# Patient Record
Sex: Female | Born: 1955 | Race: White | Hispanic: No | Marital: Single | Smoking: Former smoker
Health system: Southern US, Community
[De-identification: ages and names within clinical notes are randomized; demographics above are authoritative.]

## PROBLEM LIST (undated history)

## (undated) DIAGNOSIS — T7840XA Allergy, unspecified, initial encounter: Secondary | ICD-10-CM

## (undated) DIAGNOSIS — Z9889 Other specified postprocedural states: Secondary | ICD-10-CM

## (undated) DIAGNOSIS — R112 Nausea with vomiting, unspecified: Secondary | ICD-10-CM

## (undated) DIAGNOSIS — T4145XA Adverse effect of unspecified anesthetic, initial encounter: Secondary | ICD-10-CM

## (undated) DIAGNOSIS — T8859XA Other complications of anesthesia, initial encounter: Secondary | ICD-10-CM

## (undated) DIAGNOSIS — I10 Essential (primary) hypertension: Secondary | ICD-10-CM

## (undated) DIAGNOSIS — G43909 Migraine, unspecified, not intractable, without status migrainosus: Secondary | ICD-10-CM

## (undated) DIAGNOSIS — K219 Gastro-esophageal reflux disease without esophagitis: Secondary | ICD-10-CM

## (undated) HISTORY — DX: Allergy, unspecified, initial encounter: T78.40XA

## (undated) HISTORY — PX: DILATION AND CURETTAGE OF UTERUS: SHX78

## (undated) HISTORY — PX: HEMORRHOID SURGERY: SHX153

## (undated) HISTORY — DX: Migraine, unspecified, not intractable, without status migrainosus: G43.909

## (undated) HISTORY — DX: Essential (primary) hypertension: I10

## (undated) HISTORY — PX: LAPAROSCOPY: SHX197

---

## 1998-05-16 ENCOUNTER — Ambulatory Visit (HOSPITAL_COMMUNITY): Admission: RE | Admit: 1998-05-16 | Discharge: 1998-05-16 | Payer: Self-pay | Admitting: *Deleted

## 2001-06-09 ENCOUNTER — Other Ambulatory Visit: Admission: RE | Admit: 2001-06-09 | Discharge: 2001-06-09 | Payer: Self-pay | Admitting: *Deleted

## 2002-12-27 HISTORY — PX: OTHER SURGICAL HISTORY: SHX169

## 2002-12-28 ENCOUNTER — Other Ambulatory Visit: Admission: RE | Admit: 2002-12-28 | Discharge: 2002-12-28 | Payer: Self-pay | Admitting: Obstetrics and Gynecology

## 2003-01-18 ENCOUNTER — Ambulatory Visit (HOSPITAL_COMMUNITY): Admission: RE | Admit: 2003-01-18 | Discharge: 2003-01-18 | Payer: Self-pay | Admitting: Obstetrics and Gynecology

## 2004-05-09 ENCOUNTER — Other Ambulatory Visit: Admission: RE | Admit: 2004-05-09 | Discharge: 2004-05-09 | Payer: Self-pay | Admitting: Obstetrics and Gynecology

## 2006-01-28 ENCOUNTER — Ambulatory Visit (HOSPITAL_BASED_OUTPATIENT_CLINIC_OR_DEPARTMENT_OTHER): Admission: RE | Admit: 2006-01-28 | Discharge: 2006-01-28 | Payer: Self-pay | Admitting: *Deleted

## 2006-01-28 ENCOUNTER — Encounter (INDEPENDENT_AMBULATORY_CARE_PROVIDER_SITE_OTHER): Payer: Self-pay | Admitting: *Deleted

## 2006-03-04 ENCOUNTER — Other Ambulatory Visit: Admission: RE | Admit: 2006-03-04 | Discharge: 2006-03-04 | Payer: Self-pay | Admitting: Obstetrics and Gynecology

## 2007-03-19 ENCOUNTER — Other Ambulatory Visit: Admission: RE | Admit: 2007-03-19 | Discharge: 2007-03-19 | Payer: Self-pay | Admitting: Obstetrics and Gynecology

## 2008-05-10 ENCOUNTER — Other Ambulatory Visit: Admission: RE | Admit: 2008-05-10 | Discharge: 2008-05-10 | Payer: Self-pay | Admitting: Obstetrics & Gynecology

## 2010-05-10 ENCOUNTER — Ambulatory Visit: Payer: Self-pay | Admitting: Family Medicine

## 2010-05-15 ENCOUNTER — Encounter: Admission: RE | Admit: 2010-05-15 | Discharge: 2010-05-15 | Payer: Self-pay | Admitting: Family Medicine

## 2010-05-18 ENCOUNTER — Ambulatory Visit: Payer: Self-pay | Admitting: Family Medicine

## 2010-05-26 ENCOUNTER — Encounter: Admission: RE | Admit: 2010-05-26 | Discharge: 2010-05-26 | Payer: Self-pay | Admitting: Chiropractic Medicine

## 2010-05-28 ENCOUNTER — Ambulatory Visit: Payer: Self-pay | Admitting: Family Medicine

## 2010-09-11 ENCOUNTER — Ambulatory Visit: Payer: Self-pay | Admitting: Family Medicine

## 2011-03-15 NOTE — Op Note (Signed)
Debra Banks, Debra Banks                           ACCOUNT NO.:  [id number]   MEDICAL RECORD NO.:  [id number]                   PATIENT TYPE:  AMB   LOCATION:  [facility]                                  FACILITY:  [facility]   PHYSICIAN:  Laqueta Linden, M.D.                 DATE OF BIRTH:  1956/08/03   DATE OF PROCEDURE:  01/18/2003  DATE OF DISCHARGE:                                 OPERATIVE REPORT   PREOPERATIVE DIAGNOSES:  Labial hypertrophy.   POSTOPERATIVE DIAGNOSES:  Labial hypertrophy.   PROCEDURE:  Labial excision, revision.   SURGEON:  Laqueta Linden, M.D.   ASSISTANT:  Andres Ege, M.D.   ANESTHESIA:  General LMA, pudendal block utilizing 20 mL of 0.5% Marcaine  with epinephrine.   ESTIMATED BLOOD LOSS:  50 mL.   COUNTS:  Correct.   COMPLICATIONS:  None.   INDICATIONS:  The patient is a 55 year old female with hypertrophy of labia  minora which swell and are painful with prolonged sitting or horseback  riding.  For this reason she desires surgical excision, labial revision.  She has voiced her understanding of risks of the procedure including  infection, bleeding, swelling, hematoma possibly requiring return to the  operating room, as well as postoperative pain, as well as suboptimal  cosmetic results.  She has voiced her understanding and acceptance of all  these risks and agrees to proceed.  She has received 1 g of IV antibiotic  prophylaxis preoperatively.   PROCEDURE:  The patient was taken to the operating room.  After proper  identification and consents were ascertained she was placed on the operating  table in the supine position.  After the induction of general LMA anesthesia  she was placed in the Kathryn stirrups and the perineum and vagina were  prepped and draped in a routine sterile fashion.  The labia minora on the  patient's left was injected with 1% plain Xylocaine.  A wedge excision of  the redundant labia was then performed.  Multiple small bleeding  points were  cauterized.  Several additional edges of tissue were taken to attempt to  create a much reduced, but symmetrical labia.  The incision was then closed  in an interrupted fashion using 4-0 Vicryl suture after subcutaneous  hemostasis was ascertained.  An identical procedure was carried out on the  right labia minora with an effort to make both labia symmetrical.  The  excised tissue was discarded.  Hemostasis was noted to be excellent.  There  was no excessive swelling or evidence of hematoma formation.  Pudendal block  was then placed bilaterally using 10 mL on each side of 0.5% Marcaine with  epinephrine for local analgesia.  The patient also received Toradol 30 mg  IV, 30 mg IM prior to awakening.  An ice pack was applied to the perineum  immediately.  The patient was stable on transfer to  the recovery room.  She  will be observed at discharge per anesthesia protocol.  She is to follow up  in the office in one week's time or sooner for excessive pain, swelling,  fever, bleeding, or other concerns.  She is to use ice for 24 hours and then  begin sitz baths two to  three times daily.  She was given a prescription for [employer]  dispensed 20 one to two q.4-6h. p.r.n. pain as well as to take Advil or  Aleve as needed.  She is given routine verbal and written instructions and  discharged per anesthesia protocol.                                               Laqueta Linden, M.D.    LKS/MEDQ  D:  01/18/2003  T:  01/18/2003  Job:  [id number]

## 2011-03-15 NOTE — Op Note (Signed)
Debra Banks, Debra Banks               ACCOUNT NO.:  [id number]   MEDICAL RECORD NO.:  [id number]          PATIENT TYPE:  AMB   LOCATION:  [facility]                         FACILITY:  [facility]   PHYSICIAN:  Alfonse Ras, MD   DATE OF BIRTH:  [birth year]   DATE OF PROCEDURE:  01/28/2006  DATE OF DISCHARGE:                                 OPERATIVE REPORT   PREOPERATIVE DIAGNOSIS:  Anal polyp and internal hemorrhoids with prolapse.   POSTOPERATIVE DIAGNOSIS:  Anal polyp and internal hemorrhoids with prolapse.   PROCEDURE:  Excision of anal polyp and PPH rectopexy.   SURGEON:  Alfonse Ras, M.D.   ANESTHESIA:  General.   DESCRIPTION:  The patient was taken to the operating room and placed in a  supine position. After adequate general anesthesia was induced, the patient  was placed in prone jack-knife position.  Perianal and rectal prep were  undertaken.  The anterior rectal polyp was grasped with an Allis clamp and a  2-0 chromic pursestring suture was placed around the base of it.  It was  cauterized, removed, and sent for pathologic evaluation.  A separate very  large posterior anal skin tag was excised at its base using normal  electrocautery.   I then injected 10 mL Marcaine with Wydase into the hemorrhoidal bundles and  an additional 20 mL into the internal and external sphincter muscles.  A 2-0  Prolene suture was placed in a mucosal and submucosal fashion approximately  5 cm proximal to the dentate line.  This was done a pursestring fashion and  was circumferential.  A stapler was then placed in the rectum and the vagina  was checked after it was closed.  It was held in place for 45 seconds and  then fired.  It was removed in a very good donut was visualized without any  muscularis.  The staple line was then inspected.  There was no bleeding.  Gelfoam packing was placed.  The patient tolerated the procedure well and  went to the PACU in good condition.      Alfonse Ras,  MD  Electronically Signed     KRE/MEDQ  D:  01/28/2006  T:  01/28/2006  Job:  [id number]

## 2011-03-28 ENCOUNTER — Other Ambulatory Visit: Payer: Self-pay

## 2011-03-28 MED ORDER — FLUTICASONE PROPIONATE 50 MCG/ACT NA SUSP: 2.0000 | Freq: Every day | NASAL | Status: DC

## 2011-03-28 NOTE — Telephone Encounter (Signed)
Med was filled 

## 2011-05-24 ENCOUNTER — Ambulatory Visit (INDEPENDENT_AMBULATORY_CARE_PROVIDER_SITE_OTHER): Payer: BC Managed Care – PPO | Admitting: Family Medicine

## 2011-05-24 VITALS — BP 120/70 | HR 71 | Temp 98.2°F | Wt 165.0 lb

## 2011-05-24 DIAGNOSIS — J019 Acute sinusitis, unspecified: Secondary | ICD-10-CM

## 2011-05-24 DIAGNOSIS — Z0000 Encounter for general adult medical examination without abnormal findings: Secondary | ICD-10-CM

## 2011-05-24 DIAGNOSIS — J301 Allergic rhinitis due to pollen: Secondary | ICD-10-CM

## 2011-05-24 MED ORDER — CLARITHROMYCIN 500 MG PO TABS: 500.0000 mg | ORAL_TABLET | Freq: Two times a day (BID) | ORAL | Status: AC

## 2011-05-24 NOTE — Progress Notes (Signed)
  Subjective:    Patient ID: Debra Banks, female    DOB: June 24, 1956, 55 y.o.   MRN: [id number]  HPI She has a two-week history that started with sore throat and sinus congestion slight earache but no fever or chills. She use OTC medicines and Flonase in her symptoms got better till 2 days ago when the sinus congestion worsened as well as PND with purulent blood-tinged drainage with a sore throat. No fever or chills. She does not smoke but does have underlying allergies   Review of Systems     Objective:   Physical Exam alert and in no distress. Tympanic membranes and canals are normal. Throat is clear. Tonsils are normal. Neck is supple without adenopathy or thyromegaly. Cardiac exam shows a regular sinus rhythm without murmurs or gallops. Lungs are clear to auscultation. Nasal mucosa is pale and slightly purplish. Tender over frontal and maxillary sinuses.        Assessment & Plan:  Sinusitis with underlying allergic rhinitis Biaxin. If not entirely better, she is to call She also would like a colonoscopy set up.

## 2011-05-24 NOTE — Patient Instructions (Signed)
Take all the antibiotic and if not fully back to normal, call

## 2011-05-27 ENCOUNTER — Telehealth: Payer: Self-pay

## 2011-05-27 NOTE — Telephone Encounter (Signed)
Called and left message for appt [facility] gastroenterology [phone] aug 16th @ 1:15

## 2011-05-28 ENCOUNTER — Encounter: Payer: Self-pay | Admitting: Family Medicine

## 2011-08-21 ENCOUNTER — Telehealth: Payer: Self-pay | Admitting: Family Medicine

## 2011-08-21 NOTE — Telephone Encounter (Signed)
Patient informed that Mucinex did not make blood pressure go up. Instructed her to call if she had any questions.

## 2011-08-21 NOTE — Telephone Encounter (Signed)
Mucinex did not make his blood pressure go up. URI and coughing can make it go up and that's not dangerously high

## 2011-08-26 LAB — HM COLONOSCOPY: HM Colonoscopy: NORMAL

## 2011-08-28 ENCOUNTER — Encounter: Payer: Self-pay | Admitting: Family Medicine

## 2011-09-20 ENCOUNTER — Other Ambulatory Visit: Payer: Self-pay | Admitting: Family Medicine

## 2011-09-20 MED ORDER — LISINOPRIL-HYDROCHLOROTHIAZIDE 10-12.5 MG PO TABS: 1.0000 | ORAL_TABLET | Freq: Every day | ORAL | Status: DC

## 2011-09-20 NOTE — Telephone Encounter (Signed)
PT NEEDS REFILL LISINOPRIL-HCTZ 10/12.5 [facility] [address] [place], [place] [phone]  FAX [phone] UNABLE TO CHANGE PHARMACY IN SYSTEM

## 2011-10-25 ENCOUNTER — Other Ambulatory Visit: Payer: Self-pay | Admitting: Family Medicine

## 2011-10-25 ENCOUNTER — Telehealth: Payer: Self-pay | Admitting: Internal Medicine

## 2011-10-25 NOTE — Telephone Encounter (Signed)
Med already ordered

## 2011-11-05 ENCOUNTER — Ambulatory Visit (INDEPENDENT_AMBULATORY_CARE_PROVIDER_SITE_OTHER): Payer: BC Managed Care – PPO | Admitting: Medical

## 2011-11-05 ENCOUNTER — Encounter: Payer: Self-pay | Admitting: Medical

## 2011-11-05 DIAGNOSIS — I10 Essential (primary) hypertension: Secondary | ICD-10-CM | POA: Insufficient documentation

## 2011-11-05 DIAGNOSIS — R0609 Other forms of dyspnea: Secondary | ICD-10-CM | POA: Insufficient documentation

## 2011-11-05 DIAGNOSIS — R0789 Other chest pain: Secondary | ICD-10-CM

## 2011-11-05 DIAGNOSIS — J309 Allergic rhinitis, unspecified: Secondary | ICD-10-CM | POA: Insufficient documentation

## 2011-11-05 DIAGNOSIS — Z23 Encounter for immunization: Secondary | ICD-10-CM

## 2011-11-05 LAB — COMPREHENSIVE METABOLIC PANEL
ALT: 31 U/L (ref 0–35)
AST: 20 U/L (ref 0–37)
Albumin: 4.7 g/dL (ref 3.5–5.2)
Alkaline Phosphatase: 48 U/L (ref 39–117)
CO2: 25 mEq/L (ref 19–32)
Calcium: 9.8 mg/dL (ref 8.4–10.5)
Total Protein: 7.7 g/dL (ref 6.0–8.3)

## 2011-11-05 LAB — TSH: TSH: 1.796 u[IU]/mL (ref 0.350–4.500)

## 2011-11-05 MED ORDER — LISINOPRIL-HYDROCHLOROTHIAZIDE 10-12.5 MG PO TABS: 1.0000 | ORAL_TABLET | Freq: Every day | ORAL | Status: DC

## 2011-11-05 MED ORDER — LEVOCETIRIZINE DIHYDROCHLORIDE 5 MG PO TABS: 5.0000 mg | ORAL_TABLET | Freq: Every evening | ORAL | Status: DC

## 2011-11-05 MED ORDER — FLUTICASONE PROPIONATE 50 MCG/ACT NA SUSP: 2.0000 | Freq: Every day | NASAL | Status: DC

## 2011-11-05 NOTE — Progress Notes (Signed)
Subjective:   HPI  Debra Banks is a 56 y.o. female who presents with multiple complaints.  She needs refills on her medications for allergies and blood pressure. Her allergies are controlled with her current medications including xyzal and nasal spray. Her blood pressure has been well controlled with her current medication.    She reports a history of intermittent left arm pain for the last one month. She is left-handed. She had one bad episode of left arm pain lasting for 8 hours a month ago, but since then has intermittent arm pain that can last for 10-15 minutes to hours.  She gets occasional neck pain, has seen chiropractor on and off, but not shoulder pain, elbow pain, but instead the pain radiates down from the shoulder to the forearm; denies numbness, tingling, weakness.  Sometimes using the vacuum aggravates the pain but otherwise no specific trigger.  She also reports intermittent episodes of chest tightness for the last month.  This can happen any time, at rest, not necessarily associated with activity. Typically lasts for 10-15 minutes or less. She denies associated chest pain, shortness of breath, nausea, vomiting, sweats, radiating pain to jaw or arm, no paresthesias. In general she has noticed quicker fatigue or dyspnea on exertion compared to the past, although she is not exercising routinely.  Gets occasional palpitations.  She is a nonsmoker.  Her risk factors include controlled hypertension, but no history of diabetes, no significant hyperlipidemia in the past, but there is family history of heart disease in maternal uncles under age 66.  She wonders if these symptoms are related to her heart, wonders if she should be taking a daily baby aspirin.   She would like a flu shot today.   No other aggravating or relieving factors.    No other c/o.   The following portions of the patient's history were reviewed and updated as appropriate: allergies, current medications, past family  history, past medical history, past social history, past surgical history and problem list.  Allergies  Allergen Reactions  . Codeine     No current outpatient prescriptions on file prior to visit.    Past Medical History  Diagnosis Date  . Hypertension   . Allergy     RHINITIS    No past surgical history on file.  Family History  Problem Relation Age of Onset  . Hypertension Father   . Hypertension Sister     History   Social History  . Marital Status: Single    Spouse Name: N/A    Number of Children: N/A  . Years of Education: N/A   Occupational History  . Not on file.   Social History Main Topics  . Smoking status: Never Smoker   . Smokeless tobacco: Not on file  . Alcohol Use: Not on file  . Drug Use: Not on file  . Sexually Active: Not on file   Other Topics Concern  . Not on file   Social History Narrative  . No narrative on file    Review of Systems Constitutional: -fever, -chills, -sweats, -unexpected -weight change,-fatigue ENT: -runny nose, -ear pain, -sore throat Cardiology:  -chest pain, +chest tightness, +palpitations, -edema Respiratory: -cough, -shortness of breath, -wheezing Gastroenterology: -abdominal pain, -nausea, -vomiting, -diarrhea, -constipation  Hematology: -bleeding or bruising problems Musculoskeletal: +arthralgias, +myalgias, -joint swelling, -back pain Ophthalmology: -vision changes Urology: -dysuria, -difficulty urinating, -hematuria, -urinary frequency, -urgency Neurology: -headache, -weakness, -tingling, -numbness      Objective:   Physical Exam  Filed Vitals:  11/05/11 1401  BP: 110/68  Pulse: 72  Temp: 97.7 F (36.5 C)  Resp: 16    General appearance: alert, no distress, WD/WN,  white female  Skin: No rash  Oral cavity: MMM, no lesions Neck: supple, no lymphadenopathy, no thyromegaly, no masses, no bruit  Heart: RRR, normal S1, S2, no murmurs Lungs: CTA bilaterally, no wheezes, rhonchi, or  rales Abdomen: +bs, soft, non tender, non distended, no masses, no hepatomegaly, no splenomegaly Pulses: 2+ symmetric, upper and lower extremities, normal cap refill Neuro: CN 2-12 intact, nonfocal, normal strength and sensation, normal reflexes   MSK: Left internal range of motion slightly reduced otherwise upper extremity exam normal, normal range of motion, nontender, normal throughout upper extremities   Adult ECG Report  Indication: chest tightness  Rate: 62 bpm  Rhythm: normal sinus rhythm  QRS Axis: -17  PR Interval:  QRS Duration: 86ms  QTc:  Conduction Disturbances: none  Other Abnormalities: poor R wave progression, T wave inversion III and V1  Patient's cardiac risk factors are: hypertension and sedentary lifestyle.  EKG comparison: 04/2010 unchanged  Narrative Interpretation: left axis deviation, poor R wave progression, but otherwise no acute change    Assessment and Plan :     Encounter Diagnoses  Name Primary?  . Allergic rhinitis Yes  . Essential hypertension, benign   . Chest tightness   . DOE (dyspnea on exertion)   . Need for prophylactic vaccination and inoculation against influenza      Allergic rhinitis-control current medications, medication refills  Hypertension-controlled on current medication, medication refills, labs today  Chest tightness and dyspnea on exertion-discussed possible etiologies, although etiology is unclear. Discussed symptoms of cardiac etiology although she seems low risk, and symptoms are somewhat atypical.  EKG today unchanged from prior .  Labs today.  Will discuss case with supervising physician, and pending labs we'll decide on next steps.  Discussed symptoms of acute coronary syndrome that would prompt urgent ED visit.  Flu vaccine, vaccine counseling and VIS today.  Follow-up pending labs.

## 2011-11-06 LAB — CBC
MCH: 29.5 pg (ref 26.0–34.0)
MCHC: 34.3 g/dL (ref 30.0–36.0)
MCV: 85.9 fL (ref 78.0–100.0)
RBC: 5.12 MIL/uL — ABNORMAL HIGH (ref 3.87–5.11)
RDW: 13.2 % (ref 11.5–15.5)

## 2012-01-21 LAB — HM PAP SMEAR: HM Pap smear: NEGATIVE

## 2012-02-19 ENCOUNTER — Telehealth: Payer: Self-pay | Admitting: Internal Medicine

## 2012-02-19 MED ORDER — LEVOCETIRIZINE DIHYDROCHLORIDE 5 MG PO TABS: 5.0000 mg | ORAL_TABLET | Freq: Every evening | ORAL | Status: DC

## 2012-02-19 MED ORDER — LISINOPRIL-HYDROCHLOROTHIAZIDE 10-12.5 MG PO TABS: 1.0000 | ORAL_TABLET | Freq: Every day | ORAL | Status: DC

## 2012-02-19 NOTE — Telephone Encounter (Signed)
RX REFILLS WAS ORDERED AND SENT TO HER PHARMACY. CLS

## 2012-03-27 ENCOUNTER — Telehealth: Payer: Self-pay | Admitting: Internal Medicine

## 2012-03-27 MED ORDER — LISINOPRIL-HYDROCHLOROTHIAZIDE 10-12.5 MG PO TABS: 1.0000 | ORAL_TABLET | Freq: Every day | ORAL | Status: DC

## 2012-03-27 MED ORDER — LEVOCETIRIZINE DIHYDROCHLORIDE 5 MG PO TABS: 5.0000 mg | ORAL_TABLET | Freq: Every evening | ORAL | Status: DC

## 2012-03-27 NOTE — Telephone Encounter (Signed)
Changed to 90 day supply

## 2012-03-27 NOTE — Telephone Encounter (Signed)
i sent the patients refills to the pharmacy. CLS

## 2012-08-01 ENCOUNTER — Other Ambulatory Visit: Payer: Self-pay | Admitting: Medical

## 2012-08-31 ENCOUNTER — Other Ambulatory Visit: Payer: Self-pay | Admitting: Medical

## 2012-12-25 ENCOUNTER — Observation Stay (HOSPITAL_COMMUNITY)
Admission: EM | Admit: 2012-12-25 | Discharge: 2012-12-26 | Disposition: A | Discharge status: Home / Self Care | Payer: BC Managed Care – PPO | Attending: Internal Medicine | Admitting: Internal Medicine

## 2012-12-25 ENCOUNTER — Encounter (HOSPITAL_COMMUNITY): Payer: Self-pay | Admitting: Emergency Medicine

## 2012-12-25 ENCOUNTER — Emergency Department (HOSPITAL_COMMUNITY): Payer: BC Managed Care – PPO

## 2012-12-25 DIAGNOSIS — N951 Menopausal and female climacteric states: Secondary | ICD-10-CM | POA: Insufficient documentation

## 2012-12-25 DIAGNOSIS — G43909 Migraine, unspecified, not intractable, without status migrainosus: Secondary | ICD-10-CM | POA: Insufficient documentation

## 2012-12-25 DIAGNOSIS — Z87891 Personal history of nicotine dependence: Secondary | ICD-10-CM | POA: Insufficient documentation

## 2012-12-25 DIAGNOSIS — J309 Allergic rhinitis, unspecified: Secondary | ICD-10-CM | POA: Insufficient documentation

## 2012-12-25 DIAGNOSIS — I10 Essential (primary) hypertension: Secondary | ICD-10-CM | POA: Insufficient documentation

## 2012-12-25 DIAGNOSIS — K219 Gastro-esophageal reflux disease without esophagitis: Secondary | ICD-10-CM | POA: Insufficient documentation

## 2012-12-25 DIAGNOSIS — Z7982 Long term (current) use of aspirin: Secondary | ICD-10-CM | POA: Insufficient documentation

## 2012-12-25 DIAGNOSIS — R0609 Other forms of dyspnea: Secondary | ICD-10-CM

## 2012-12-25 DIAGNOSIS — R0789 Other chest pain: Principal | ICD-10-CM | POA: Insufficient documentation

## 2012-12-25 DIAGNOSIS — Z79899 Other long term (current) drug therapy: Secondary | ICD-10-CM | POA: Insufficient documentation

## 2012-12-25 LAB — CBC WITH DIFFERENTIAL/PLATELET
Eosinophils Absolute: 0.1 10*3/uL (ref 0.0–0.7)
Hemoglobin: 16.2 g/dL — ABNORMAL HIGH (ref 12.0–15.0)
Lymphocytes Relative: 22 % (ref 12–46)
Lymphs Abs: 2 10*3/uL (ref 0.7–4.0)
MCV: 85.5 fL (ref 78.0–100.0)
Monocytes Absolute: 0.5 10*3/uL (ref 0.1–1.0)
Monocytes Relative: 5 % (ref 3–12)
Neutrophils Relative %: 71 % (ref 43–77)
Platelets: 366 10*3/uL (ref 150–400)
RBC: 5.19 MIL/uL — ABNORMAL HIGH (ref 3.87–5.11)
RDW: 12.9 % (ref 11.5–15.5)

## 2012-12-25 LAB — COMPREHENSIVE METABOLIC PANEL
AST: 19 U/L (ref 0–37)
Alkaline Phosphatase: 58 U/L (ref 39–117)
BUN: 22 mg/dL (ref 6–23)
Creatinine, Ser: 0.85 mg/dL (ref 0.50–1.10)
GFR calc Af Amer: 88 mL/min — ABNORMAL LOW (ref >90)
Glucose, Bld: 102 mg/dL — ABNORMAL HIGH (ref 70–99)
Potassium: 3.8 mEq/L (ref 3.5–5.1)
Sodium: 139 mEq/L (ref 135–145)
Total Bilirubin: 0.3 mg/dL (ref 0.3–1.2)

## 2012-12-25 LAB — POCT I-STAT TROPONIN I: Troponin i, poc: 0 ng/mL (ref 0.00–0.08)

## 2012-12-25 MED ORDER — SODIUM CHLORIDE 0.9 % IV SOLN
INTRAVENOUS | Status: AC
  Administered 2012-12-26: via INTRAVENOUS

## 2012-12-25 NOTE — H&P (Signed)
PCP:  Carollee Herter, MD    Chief Complaint:  Chest tightness  HPI: Debra Banks is a 57 y.o. female   has a past medical history of Hypertension; Allergy; and Migraines.   Presented with  Chest tightness started today at noon and continues until 10 pm. 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 Initially she was short of breath but it went away she also felt tightness in both sides of her neck. She felt cold and clammy. Patient states that she had a bit of nausea but never vomited. She reports that she never had a stress test.  Patient has been on testosterone and progesteron therapy to "make her feel better" and to help her deal with menopause.    Chest x-ray EKG and cardiac markers so far negative hospitalist admit for further workup.    Review of Systems:    Pertinent positives include: chest pain, dyspnea on exertion, nausea,   Constitutional:  No weight loss, night sweats, Fevers, chills, fatigue, weight loss  HEENT:  No headaches, Difficulty swallowing,Tooth/dental problems,Sore throat,  No sneezing, itching, ear ache, nasal congestion, post nasal drip,  Cardio-vascular:  No Orthopnea, PND, anasarca, dizziness, palpitations.no Bilateral lower extremity swelling  GI:  No heartburn, indigestion, abdominal pain, vomiting, diarrhea, change in bowel habits, loss of appetite, melena, blood in stool, hematemesis Resp:  no shortness of breath at rest. No excess mucus, no productive cough, No non-productive cough, No coughing up of blood.No change in color of mucus.No wheezing. Skin:  no rash or lesions. No jaundice GU:  no dysuria, change in color of urine, no urgency or frequency. No straining to urinate.  No flank pain.  Musculoskeletal:  No joint pain or no joint swelling. No decreased range of motion. No back pain.  Psych:  No change in mood or affect. No depression or anxiety. No memory loss.  Neuro:  no localizing neurological complaints, no tingling, no weakness, no double vision, no gait abnormality, no slurred speech, no confusion  Otherwise ROS are negative except for above, 10 systems were reviewed  Past Medical History: Past Medical History  Diagnosis Date  . Hypertension   . Allergy     RHINITIS  . Migraines     with aura   Past Surgical History  Procedure Laterality Date  . Laparoscopy      age 88 to r/o ovarian cyst  . Labial revision  12/2002    secondary to hypertrophy     Medications: Prior to Admission medications   Medication Sig Start Date End Date Taking? Authorizing Provider  aspirin EC 81 MG tablet Take 243 mg by mouth once.   Yes Historical Provider, MD  Cholecalciferol (VITAMIN D-3 PO) Take 1 tablet by mouth daily.   Yes Historical Provider, MD  fish oil-omega-3 fatty acids 1000 MG capsule Take 1 g by mouth daily.   Yes Historical Provider, MD  levocetirizine (XYZAL) 5 MG tablet Take 5 mg by mouth every evening.   Yes Historical Provider, MD  lisinopril-hydrochlorothiazide (PRINZIDE,ZESTORETIC) 10-12.5 MG per tablet Take 1 tablet by mouth daily. 03/27/12  Yes Kermit Balo Tysinger, PA  Magnesium 500 MG TABS Take 1 tablet by mouth daily.   Yes Historical Provider, MD  progesterone (PROMETRIUM) 200 MG capsule Take 200 mg by mouth daily.   Yes Historical Provider, MD  ranitidine (ZANTAC) 150 MG tablet Take 150 mg by mouth daily.   Yes Historical Provider, MD  Testosterone 75  MG PLLT 1 each by Implant route once.   Yes Historical Provider, MD    Allergies:   Allergies  Allergen Reactions  . Codeine     Social History:  Ambulatory  Independently  Lives at  Home with family   reports that she has quit smoking. She does not have any smokeless tobacco history on file. She reports that  drinks alcohol. She reports that she does not use illicit drugs.   Family History: family history includes Hyperlipidemia in her mother and Hypertension in her father and  sister.    Physical Exam: Patient Vitals for the past 24 hrs:  BP Temp Temp src Pulse Resp SpO2  12/25/12 2245 114/72 mmHg - - 78 15 94 %  12/25/12 2230 113/62 mmHg - - 81 19 96 %  12/25/12 2200 135/84 mmHg - - 88 15 95 %  12/25/12 2030 137/90 mmHg - - 105 18 98 %  12/25/12 1914 142/81 mmHg 98.1 F (36.7 C) Oral 86 16 96 %    1. General:  in No Acute distress 2. Psychological: Alert and  Oriented 3. Head/ENT:   Moist  Mucous Membranes                          Head Non traumatic, neck supple                          Normal  Dentition 4. SKIN: normal Skin turgor,  Skin clean Dry and intact no rash 5. Heart: Regular rate and rhythm no Murmur, Rub or gallop 6. Lungs: Clear to auscultation bilaterally, no wheezes or crackles   7. Abdomen: Soft, non-tender, Non distended 8. Lower extremities: no clubbing, cyanosis, or edema 9. Neurologically Grossly intact, moving all 4 extremities equally 10. MSK: Normal range of motion  body mass index is unknown because there is no height or weight on file.   Labs on Admission:   Recent Labs  12/25/12 1913  NA 139  K 3.8  CL 101  CO2 25  GLUCOSE 102*  BUN 22  CREATININE 0.85  CALCIUM 10.3    Recent Labs  12/25/12 1913  AST 19  ALT 24  ALKPHOS 58  BILITOT 0.3  PROT 8.8*  ALBUMIN 4.6   No results found for this basename: LIPASE, AMYLASE,  in the last 72 hours  Recent Labs  12/25/12 1913  WBC 9.0  NEUTROABS 6.4  HGB 16.2*  HCT 44.4  MCV 85.5  PLT 366   No results found for this basename: CKTOTAL, CKMB, CKMBINDEX, TROPONINI,  in the last 72 hours No results found for this basename: TSH, T4TOTAL, FREET3, T3FREE, THYROIDAB,  in the last 72 hours No results found for this basename: VITAMINB12, FOLATE, FERRITIN, TIBC, IRON, RETICCTPCT,  in the last 72 hours No results found for this basename: HGBA1C    CrCl is unknown because there is no height on file for the current visit. ABG No results found for this basename: phart,  pco2, po2, hco3, tco2, acidbasedef, o2sat     No results found for this basename: DDIMER     Other results:  I have pearsonaly reviewed this: ECG REPORT  Rate: 88   Rhythm: NSR ST&T Change: no ischemic changes   Cultures: No results found for this basename: sdes, specrequest, cult, reptstatus       Radiological Exams on Admission: Dg Chest 2 View  12/25/2012  *RADIOLOGY REPORT*  Clinical Data: Chest pain  CHEST - 2 VIEW  Comparison: None.  Findings: Lungs are clear.  No pleural effusion or pneumothorax.  Cardiomediastinal silhouette is within normal limits.  Mild degenerative changes of the visualized thoracolumbar spine.  IMPRESSION: No evidence of acute cardiopulmonary disease.   Original Report Authenticated By: Charline Bills, M.D.     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 Chest tightness - somewhat atypical giving  that lasted for 10 hours. Currently chest pain-free we'll admit to telemetry cycle cardiac markers obtained serial EKGs an echogram to evaluate wall motion abnormality. Father risk stratify with lipid panel check TSH. Other studies depending on results. Will continue patient's aspirin but hold who hormonal therapy.  . Essential hypertension, benign - continue lisinopril    Prophylaxis:  Lovenox, Protonix  CODE STATUS: FULL CODE   Other plan as per orders.  I have spent a total of 55 min on this admission  Debra Banks 12/25/2012, 11:33 PM

## 2012-12-25 NOTE — ED Notes (Signed)
Pt states she took three aspirins prior to arriving to ED

## 2012-12-25 NOTE — ED Notes (Signed)
PT. REPORTS LEFT CHEST PRESSURE / TIGHTNESS RADIATING TO LEFT ARM ONSET TODAY , DENIES SOB , OCCASIONAL DRY COUGH WITH SLIGHT NAUSEA.

## 2012-12-25 NOTE — ED Provider Notes (Signed)
History     CSN: [id number]  Arrival date & time 12/25/12  1903   First MD Initiated Contact with Patient 12/25/12 2149      Chief Complaint  Patient presents with  . Chest Pain    (Consider location/radiation/quality/duration/timing/severity/associated sxs/prior treatment) HPI Comments: Patient presents here with discomfort in the left chest and left arm that has occurred intermittently over the past two weeks.  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  She feels somewhat short of breath and nauseated.  No fevers or chills.  No cough.  She reports two other episodes in the past two weeks similar to this but not as bad.  Risk factors include htn, family history.    Patient is a 57 y.o. female presenting with chest pain. The history is provided by the patient.  Chest Pain Pain location:  Substernal area and L chest Pain quality comment:  Heaviness Pain radiates to:  L arm Pain radiates to the back: no   Pain severity:  Moderate Timing:  Intermittent Progression:  Worsening Chronicity:  Recurrent Relieved by:  Nothing Worsened by:  Nothing tried Ineffective treatments:  None tried   Past Medical History  Diagnosis Date  . Hypertension   . Allergy     RHINITIS  . Migraines     with aura    Past Surgical History  Procedure Laterality Date  . Laparoscopy      age 47 to r/o ovarian cyst  . Labial revision  12/2002    secondary to hypertrophy    Family History  Problem Relation Age of Onset  . Hypertension Father   . Hypertension Sister   . Hyperlipidemia Mother     History  Substance Use Topics  . Smoking status: Former Games developer  . Smokeless tobacco: Not on file  . Alcohol Use: Yes     Comment: 12 pack a week 2-3 days a week    OB History   Grav Para Term Preterm Abortions TAB SAB Ect Mult Living   0 0 0 0 0 0 0 0 0 0       Review of Systems  Cardiovascular: Positive for chest pain.  All  other systems reviewed and are negative.    Allergies  Codeine  Home Medications   Current Outpatient Rx  Name  Route  Sig  Dispense  Refill  . aspirin EC 81 MG tablet   Oral   Take 243 mg by mouth once.         . Cholecalciferol (VITAMIN D-3 PO)   Oral   Take 1 tablet by mouth daily.         . fish oil-omega-3 fatty acids 1000 MG capsule   Oral   Take 1 g by mouth daily.         Marland Kitchen levocetirizine (XYZAL) 5 MG tablet   Oral   Take 5 mg by mouth every evening.         Marland Kitchen lisinopril-hydrochlorothiazide (PRINZIDE,ZESTORETIC) 10-12.5 MG per tablet   Oral   Take 1 tablet by mouth daily.   90 tablet   1   . Magnesium 500 MG TABS   Oral   Take 1 tablet by mouth daily.         . progesterone (PROMETRIUM) 200 MG capsule   Oral   Take 200 mg by mouth daily.         . ranitidine (ZANTAC) 150 MG  tablet   Oral   Take 150 mg by mouth daily.         . Testosterone 75 MG PLLT   Implant   1 each by Implant route once.           BP 114/72  Pulse 78  Temp(Src) 98.1 F (36.7 C) (Oral)  Resp 15  SpO2 94%  LMP 11/24/2012  Physical Exam  Nursing note and vitals reviewed. Constitutional: She is oriented to person, place, and time. She appears well-developed and well-nourished. No distress.  HENT:  Head: Normocephalic and atraumatic.  Neck: Normal range of motion. Neck supple.  Cardiovascular: Normal rate and regular rhythm.  Exam reveals no gallop and no friction rub.   No murmur heard. Pulmonary/Chest: Effort normal and breath sounds normal. No respiratory distress. She has no wheezes.  Abdominal: Soft. Bowel sounds are normal. She exhibits no distension. There is no tenderness.  Musculoskeletal: Normal range of motion.  Neurological: She is alert and oriented to person, place, and time.  Skin: Skin is warm and dry. She is not diaphoretic.    ED Course  Procedures (including critical care time)  Labs Reviewed  COMPREHENSIVE METABOLIC PANEL -  Abnormal; Notable for the following:    Glucose, Bld 102 (*)    Total Protein 8.8 (*)    GFR calc non Af Amer 76 (*)    GFR calc Af Amer 88 (*)    All other components within normal limits  CBC WITH DIFFERENTIAL - Abnormal; Notable for the following:    RBC 5.19 (*)    Hemoglobin 16.2 (*)    MCHC 36.5 (*)    All other components within normal limits  POCT I-STAT TROPONIN I  POCT I-STAT TROPONIN I   Dg Chest 2 View  12/25/2012  *RADIOLOGY REPORT*  Clinical Data: Chest pain  CHEST - 2 VIEW  Comparison: None.  Findings: Lungs are clear.  No pleural effusion or pneumothorax.  Cardiomediastinal silhouette is within normal limits.  Mild degenerative changes of the visualized thoracolumbar spine.  IMPRESSION: No evidence of acute cardiopulmonary disease.   Original Report Authenticated By: Charline Bills, M.D.      No diagnosis found.   Date: 12/25/2012  Rate: 96  Rhythm: normal sinus rhythm  QRS Axis: left  Intervals: normal  ST/T Wave abnormalities: normal  Conduction Disutrbances:none  Narrative Interpretation:   Old EKG Reviewed: unchanged    MDM  The patient presents here for evaluation of chest discomfort.  The workup, including two troponins is negative.  She is feeling better now.  She took 3 baby aspirins at home prior to arrival.  Due to the nature of her symptoms and the presence of risk factors, I have spoken with [facility] regarding admission.          Geoffery Lyons, MD 12/25/12 [phone]

## 2012-12-26 ENCOUNTER — Observation Stay (HOSPITAL_COMMUNITY): Payer: BC Managed Care – PPO

## 2012-12-26 DIAGNOSIS — E785 Hyperlipidemia, unspecified: Secondary | ICD-10-CM | POA: Diagnosis present

## 2012-12-26 DIAGNOSIS — R079 Chest pain, unspecified: Secondary | ICD-10-CM

## 2012-12-26 LAB — TROPONIN I
Troponin I: <0.3 ng/mL (ref <0.30)
Troponin I: <0.3 ng/mL (ref <0.30)
Troponin I: <0.3 ng/mL (ref <0.30)

## 2012-12-26 LAB — COMPREHENSIVE METABOLIC PANEL
AST: 15 U/L (ref 0–37)
BUN: 17 mg/dL (ref 6–23)
CO2: 26 mEq/L (ref 19–32)
Calcium: 10 mg/dL (ref 8.4–10.5)
Chloride: 104 mEq/L (ref 96–112)
Creatinine, Ser: 0.86 mg/dL (ref 0.50–1.10)
GFR calc non Af Amer: 75 mL/min — ABNORMAL LOW (ref >90)
Total Bilirubin: 0.5 mg/dL (ref 0.3–1.2)

## 2012-12-26 LAB — TSH: TSH: 7.355 u[IU]/mL — ABNORMAL HIGH (ref 0.350–4.500)

## 2012-12-26 LAB — PHOSPHORUS: Phosphorus: 3.8 mg/dL (ref 2.3–4.6)

## 2012-12-26 LAB — LIPID PANEL
LDL Cholesterol: 153 mg/dL — ABNORMAL HIGH (ref 0–99)
Total CHOL/HDL Ratio: 4 RATIO
Triglycerides: 102 mg/dL (ref <150)
VLDL: 20 mg/dL (ref 0–40)

## 2012-12-26 LAB — CBC
Hemoglobin: 15.4 g/dL — ABNORMAL HIGH (ref 12.0–15.0)
MCH: 30.3 pg (ref 26.0–34.0)
Platelets: 341 10*3/uL (ref 150–400)
RBC: 5.08 MIL/uL (ref 3.87–5.11)
WBC: 8.6 10*3/uL (ref 4.0–10.5)

## 2012-12-26 MED ORDER — LISINOPRIL 10 MG PO TABS
10.0000 mg | ORAL_TABLET | Freq: Every day | ORAL | Status: DC
  Administered 2012-12-26: 10 mg via ORAL
  Filled 2012-12-26: qty 1

## 2012-12-26 MED ORDER — ACETAMINOPHEN 650 MG RE SUPP: 650.0000 mg | Freq: Four times a day (QID) | RECTAL | Status: DC | PRN

## 2012-12-26 MED ORDER — MORPHINE SULFATE 2 MG/ML IJ SOLN: 1.0000 mg | INTRAMUSCULAR | Status: DC | PRN

## 2012-12-26 MED ORDER — HYDROCODONE-ACETAMINOPHEN 5-325 MG PO TABS: 1.0000 | ORAL_TABLET | ORAL | Status: DC | PRN

## 2012-12-26 MED ORDER — REGADENOSON 0.4 MG/5ML IV SOLN
INTRAVENOUS | Status: AC
  Filled 2012-12-26: qty 5

## 2012-12-26 MED ORDER — ONDANSETRON HCL 4 MG/2ML IJ SOLN: 4.0000 mg | Freq: Four times a day (QID) | INTRAMUSCULAR | Status: DC | PRN

## 2012-12-26 MED ORDER — ATORVASTATIN CALCIUM 20 MG PO TABS
20.0000 mg | ORAL_TABLET | Freq: Every day | ORAL | Status: DC
  Filled 2012-12-26: qty 1

## 2012-12-26 MED ORDER — ALBUTEROL SULFATE (5 MG/ML) 0.5% IN NEBU: 2.5000 mg | INHALATION_SOLUTION | RESPIRATORY_TRACT | Status: DC | PRN

## 2012-12-26 MED ORDER — ONDANSETRON HCL 4 MG PO TABS: 4.0000 mg | ORAL_TABLET | Freq: Four times a day (QID) | ORAL | Status: DC | PRN

## 2012-12-26 MED ORDER — ENOXAPARIN SODIUM 40 MG/0.4ML ~~LOC~~ SOLN
40.0000 mg | SUBCUTANEOUS | Status: DC
  Administered 2012-12-26: 40 mg via SUBCUTANEOUS
  Filled 2012-12-26: qty 0.4

## 2012-12-26 MED ORDER — TECHNETIUM TC 99M SESTAMIBI - CARDIOLITE
10.0000 | Freq: Once | INTRAVENOUS | Status: AC | PRN
  Administered 2012-12-26: 10 via INTRAVENOUS

## 2012-12-26 MED ORDER — ASPIRIN EC 81 MG PO TBEC
243.0000 mg | DELAYED_RELEASE_TABLET | Freq: Once | ORAL | Status: DC
  Filled 2012-12-26: qty 3

## 2012-12-26 MED ORDER — REGADENOSON 0.4 MG/5ML IV SOLN
0.4000 mg | Freq: Once | INTRAVENOUS | Status: AC
  Administered 2012-12-26: 0.4 mg via INTRAVENOUS

## 2012-12-26 MED ORDER — ATORVASTATIN CALCIUM 20 MG PO TABS: 20.0000 mg | ORAL_TABLET | Freq: Every day | ORAL | Status: DC

## 2012-12-26 MED ORDER — TECHNETIUM TC 99M SESTAMIBI - CARDIOLITE
30.0000 | Freq: Once | INTRAVENOUS | Status: AC | PRN
  Administered 2012-12-26: 11:00:00 30 via INTRAVENOUS

## 2012-12-26 MED ORDER — ACETAMINOPHEN 325 MG PO TABS: 650.0000 mg | ORAL_TABLET | Freq: Four times a day (QID) | ORAL | Status: DC | PRN

## 2012-12-26 MED ORDER — PANTOPRAZOLE SODIUM 40 MG PO TBEC
40.0000 mg | DELAYED_RELEASE_TABLET | Freq: Every day | ORAL | Status: DC
  Administered 2012-12-26: 40 mg via ORAL
  Filled 2012-12-26: qty 1

## 2012-12-26 MED ORDER — SODIUM CHLORIDE 0.9 % IJ SOLN: 3.0000 mL | Freq: Two times a day (BID) | INTRAMUSCULAR | Status: DC

## 2012-12-26 MED ORDER — DOCUSATE SODIUM 100 MG PO CAPS
100.0000 mg | ORAL_CAPSULE | Freq: Two times a day (BID) | ORAL | Status: DC
  Filled 2012-12-26 (×3): qty 1

## 2012-12-26 NOTE — Consult Note (Signed)
CARDIOLOGY CONSULT NOTE  Patient ID: Debra Banks MRN: [id number] DOB/AGE: [birth year] 55 y.o.  Admit date: 12/25/2012 Referring Physician  Ripu Rai Primary Physician:  Carollee Herter, MD Reason for Consultation  chest pain  HPI: Patient is a 57 year old Caucasian female with history of hypertension and allergic rhinitis, history of migraine headaches who was recently evaluated in the outpatient setting for atypical left arm pain. She had been doing well until about a week ago she had a recurrence of left arm discomfort and also left-sided chest discomfort that lasted for about 2 hours on and off. Yesterday she started to have more severe pain and lasted for several hours with waxing and waning nature. This is associated with mild feeling of clamminess in her hands. She felt nauseous but did not have vomiting. No associated shortness of breath. She felt the chest pain was radiating up her neck. This was at rest and usual activities. No hemoptysis, no leg edema, no recent long travel. And no dizziness or syncope.  Past Medical History  Diagnosis Date  . Hypertension   . Allergy     RHINITIS  . Migraines     with aura     Past Surgical History  Procedure Laterality Date  . Laparoscopy      age 57 to r/o ovarian cyst  . Labial revision  12/2002    secondary to hypertrophy     Family History  Problem Relation Age of Onset  . Hypertension Father   . Hypertension Sister   . Hyperlipidemia Mother      Social History: History   Social History  . Marital Status: Single    Spouse Name: N/A    Number of Children: N/A  . Years of Education: N/A   Occupational History  . Not on file.   Social History Main Topics  . Smoking status: Former Smoker, smoked rarely  . Smokeless tobacco: Not on file  . Alcohol Use: Yes     Comment: 12 pack a week 2-3 days a week  . Drug Use: No  . Sexually Active: Not on file   Other Topics Concern  . Not on file   Social History Narrative   . No narrative on file     Prescriptions prior to admission  Medication Sig Dispense Refill  . aspirin EC 81 MG tablet Take 243 mg by mouth once.      . Cholecalciferol (VITAMIN D-3 PO) Take 1 tablet by mouth daily.      . fish oil-omega-3 fatty acids 1000 MG capsule Take 1 g by mouth daily.      Marland Kitchen levocetirizine (XYZAL) 5 MG tablet Take 5 mg by mouth every evening.      Marland Kitchen lisinopril-hydrochlorothiazide (PRINZIDE,ZESTORETIC) 10-12.5 MG per tablet Take 1 tablet by mouth daily.  90 tablet  1  . Magnesium 500 MG TABS Take 1 tablet by mouth daily.      . progesterone (PROMETRIUM) 200 MG capsule Take 200 mg by mouth daily.      . ranitidine (ZANTAC) 150 MG tablet Take 150 mg by mouth daily.      . Testosterone 75 MG PLLT 1 each by Implant route once.        Scheduled Meds: . sodium chloride   Intravenous STAT  . aspirin EC  243 mg Oral Once  . atorvastatin  20 mg Oral q1800  . docusate sodium  100 mg Oral BID  . enoxaparin (LOVENOX) injection  40 mg Subcutaneous Q24H  . lisinopril  10 mg Oral Daily  . pantoprazole  40 mg Oral Q1200  . sodium chloride  3 mL Intravenous Q12H   Continuous Infusions:  PRN Meds:.acetaminophen, acetaminophen, albuterol, HYDROcodone-acetaminophen, morphine injection, ondansetron (ZOFRAN) IV, ondansetron  ROS: General: no fevers/chills/night sweats Eyes: no blurry vision, diplopia, or amaurosis ENT: no sore throat or hearing loss Resp: no cough, wheezing, or hemoptysis CV: no edema or palpitations GI: no abdominal pain, nausea, vomiting, diarrhea, or constipation GU: no dysuria, frequency, or hematuria Skin: no rash Neuro: no headache, numbness, tingling, or weakness of extremities Musculoskeletal: no joint pain or swelling Heme: no bleeding, DVT, or easy bruising Endo: no polydipsia or polyuria    Physical Exam: Blood pressure 129/79, pulse 68, temperature 98.1 F (36.7 C), temperature source Oral, resp. rate 19, height 5\' 5"  (1.651 m), weight  73.8 kg (162 lb 11.2 oz), last menstrual period 11/23/2012, SpO2 98.00%.   General appearance: alert, cooperative, appears stated age, no distress and mildly obese Neck: no adenopathy, no carotid bruit, no JVD, supple, symmetrical, trachea midline and thyroid not enlarged, symmetric, no tenderness/mass/nodules Lungs: clear to auscultation bilaterally Chest wall: no tenderness Heart: regular rate and rhythm, S1, S2 normal, no murmur, click, rub or gallop Abdomen: soft, non-tender; bowel sounds normal; no masses,  no organomegaly Extremities: extremities normal, atraumatic, no cyanosis or edema Neurologic: Grossly normal  Labs:   Lab Results  Component Value Date   WBC 8.6 12/26/2012   HGB 15.4* 12/26/2012   HCT 43.4 12/26/2012   MCV 85.4 12/26/2012   PLT 341 12/26/2012    Recent Labs Lab 12/26/12 0500  NA 141  K 3.5  CL 104  CO2 26  BUN 17  CREATININE 0.86  CALCIUM 10.0  PROT 7.9  BILITOT 0.5  ALKPHOS 50  ALT 21  AST 15  GLUCOSE 88   Lab Results  Component Value Date   TROPONINI <0.30 12/26/2012    Lipid Panel     Component Value Date/Time   CHOL 231* 12/26/2012 0159   TRIG 102 12/26/2012 0159   HDL 58 12/26/2012 0159   CHOLHDL 4.0 12/26/2012 0159   VLDL 20 12/26/2012 0159   LDLCALC 153* 12/26/2012 0159    EKG: normal EKG, normal sinus rhythm, unchanged from previous tracings.    Radiology: Dg Chest 2 View  12/25/2012  *RADIOLOGY REPORT*  Clinical Data: Chest pain  CHEST - 2 VIEW  Comparison: None.  Findings: Lungs are clear.  No pleural effusion or pneumothorax.  Cardiomediastinal silhouette is within normal limits.  Mild degenerative changes of the visualized thoracolumbar spine.  IMPRESSION: No evidence of acute cardiopulmonary disease.   Original Report Authenticated By: Charline Bills, M.D.     ASSESSMENT AND PLAN:  1. Atypical chest pain 2. Hypertension 3. Hyperlipidemia  Recommendation: Chest pain appears to be atypical, however some of the features are consistent  with unstable angina pectoris. Patient still has mild chest discomfort and persistent but a physical examination and EKG are unremarkable. She's been ruled out by cardiac markers for myocardial injury. I will set her up for a Lexiscan tetrofosmin stress test and unless abnormal, primary prevention strategies indicated. She'll need therapy for hyperlipidemia.   Pamella Pert, MD 12/26/2012, 10:23 AM [facility] [place] Pager: [phone] Office: [phone] If no answer Cell [phone]

## 2012-12-26 NOTE — Progress Notes (Signed)
Patient ID: Debra Banks  female  YQM:[id number]    DOB: 1956-08-26    DOA: 12/25/2012  PCP: Debra Herter, MD  Assessment/Plan: Active Problems:   Chest tightness; currently resolved, during exertion, was factors, hypertension, hyperlipidemia - 2 sets of troponins negative, EKG did not show any acute ischemic changes, no prior cardiac workup ever - 2-D echo ordered, pending - Discussed with Dr. Jacinto Halim for cardiology consult may need a stress test inpatient versus outpatient    Essential hypertension, benign; - Stable, on lisinopril and hydrochlorothiazide    Hyperlipidemia: LDL 153, cholesterol 231 - Started on Lipitor    GERD  - Continue PPI   DVT Prophylaxis:  Code Status:  Disposition: Pending cardiology evaluation, 2-D echo     Subjective: States chest tightness has resolved, no shortness of breath, no nausea vomiting  Objective: Weight change:   Intake/Output Summary (Last 24 hours) at 12/26/12 0934 Last data filed at 12/26/12 0659  Gross per 24 hour  Intake  337.5 ml  Output      0 ml  Net  337.5 ml   Blood pressure 115/69, pulse 73, temperature 97.9 F (36.6 C), temperature source Oral, resp. rate 18, height 5\' 5"  (1.651 m), weight 73.8 kg (162 lb 11.2 oz), last menstrual period 11/23/2012, SpO2 99.00%.  Physical Exam: General: Alert and awake, oriented x3, not in any acute distress. HEENT: anicteric sclera, pupils reactive to light and accommodation, EOMI CVS: S1-S2 clear, no murmur rubs or gallops Chest: clear to auscultation bilaterally, no wheezing, rales or rhonchi Abdomen: soft nontender, nondistended, normal bowel sounds, no organomegaly Extremities: no cyanosis, clubbing or edema noted bilaterally Neuro: Cranial nerves II-XII intact, no focal neurological deficits  Lab Results: Basic Metabolic Panel:  Recent Labs Lab 12/25/12 1913 12/26/12 0500  NA 139 141  K 3.8 3.5  CL 101 104  CO2 25 26  GLUCOSE 102* 88  BUN 22 17  CREATININE  0.85 0.86  CALCIUM 10.3 10.0  MG  --  2.3  PHOS  --  3.8   Liver Function Tests:  Recent Labs Lab 12/25/12 1913 12/26/12 0500  AST 19 15  ALT 24 21  ALKPHOS 58 50  BILITOT 0.3 0.5  PROT 8.8* 7.9  ALBUMIN 4.6 4.2   No results found for this basename: LIPASE, AMYLASE,  in the last 168 hours No results found for this basename: AMMONIA,  in the last 168 hours CBC:  Recent Labs Lab 12/25/12 1913 12/26/12 0500  WBC 9.0 8.6  NEUTROABS 6.4  --   HGB 16.2* 15.4*  HCT 44.4 43.4  MCV 85.5 85.4  PLT 366 341   Cardiac Enzymes:  Recent Labs Lab 12/26/12 0158 12/26/12 0810  TROPONINI <0.30 <0.30   BNP: No components found with this basename: POCBNP,  CBG: No results found for this basename: GLUCAP,  in the last 168 hours   Micro Results: No results found for this or any previous visit (from the past 240 hour(s)).  Studies/Results: Dg Chest 2 View  12/25/2012  *RADIOLOGY REPORT*  Clinical Data: Chest pain  CHEST - 2 VIEW  Comparison: None.  Findings: Lungs are clear.  No pleural effusion or pneumothorax.  Cardiomediastinal silhouette is within normal limits.  Mild degenerative changes of the visualized thoracolumbar spine.  IMPRESSION: No evidence of acute cardiopulmonary disease.   Original Report Authenticated By: Charline Bills, M.D.     Medications: Scheduled Meds: . sodium chloride   Intravenous STAT  . aspirin EC  243 mg Oral Once  .  atorvastatin  20 mg Oral q1800  . docusate sodium  100 mg Oral BID  . enoxaparin (LOVENOX) injection  40 mg Subcutaneous Q24H  . lisinopril  10 mg Oral Daily  . pantoprazole  40 mg Oral Q1200  . sodium chloride  3 mL Intravenous Q12H      LOS: 1 day   [name] M.D. [facility] 12/26/2012, 9:34 AM Pager: [phone]  If 7PM-7AM, please contact night-coverage [email or web address] Password [email or web address]

## 2012-12-26 NOTE — Discharge Summary (Signed)
Physician Discharge Summary  Patient ID: Debra Banks MRN: [id number] DOB/AGE: 1956-08-17 57 y.o.  Admit date: 12/25/2012 Discharge date: 12/26/2012  Primary Care Physician:  Carollee Herter, MD  Discharge Diagnoses:    . Essential hypertension, benign . Chest tightness . hyperlipidemia . GERD   Consults: cardiology, Dr Jacinto Halim    Discharge Medications:   Medication List    TAKE these medications       aspirin EC 81 MG tablet  .     atorvastatin 20 MG tablet  Commonly known as:  LIPITOR  Take 1 tablet (20 mg total) by mouth daily      fish oil-omega-3 fatty acids 1000 MG capsule  Take 1 g by mouth daily.     levocetirizine 5 MG tablet  Commonly known as:  XYZAL  Take 5 mg by mouth every evening.     lisinopril-hydrochlorothiazide 10-12.5 MG per tablet  Commonly known as:  PRINZIDE,ZESTORETIC  Take 1 tablet by mouth daily.     Magnesium 500 MG Tabs  Take 1 tablet by mouth daily.     progesterone 200 MG capsule  Commonly known as:  PROMETRIUM  Take 200 mg by mouth daily.     ranitidine 150 MG tablet  Commonly known as:  ZANTAC  Take 150 mg by mouth daily.     Testosterone 75 MG Pllt  1 each by Implant route once.     VITAMIN D-3 PO  Take 1 tablet by mouth daily.         Brief H and P: For complete details please refer to admission H and P, but in brief Debra Banks is a 57 y.o. female has a past medical history of Hypertension; Allergy; and Migraines. Presented with Chest tightness that started on the same day of admission at noon and continued until 10 pm. 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 Initially she was short of breath but it went away she also felt tightness in both sides of her neck. She felt cold and clammy. Patient states that she had a bit of nausea but never vomited. She reported that she never had a stress test. Patient has been on testosterone and progesteron  therapy to "make her feel better" and to help her deal with menopause. Chest x-ray EKG and cardiac markers so far negative      Hospital Course:  Chest tightness: resolved during hospitalisation, during exertion. Risk factors were hypertension, hyperlipidemia. Patient was admitted for observation, 3 sets of cardiac enzymes were negative. EKG did not show any acute ischemic changes, no prior cardiac workup ever.  Cardiology (Dr Jacinto Halim) was consulted and patient underwent nuclear medicine stress test and showed EF of 77%, negative for any pharmacological stress-induced ischemia. Patient was placed on aspirin and started on Lipitor for hyperlipidemia. She was cleared by cardiology for DC home.   Essential hypertension, benign:  Stable, on lisinopril and hydrochlorothiazide   Hyperlipidemia: LDL 153, cholesterol 231, Started on Lipitor   GERD: Continue ranitidine  Day of Discharge BP 128/66  Pulse 111  Temp(Src) 98.1 F (36.7 C) (Oral)  Resp 19  Ht 5\' 5"  (1.651 m)  Wt 73.8 kg (162 lb 11.2 oz)  BMI 27.07 kg/m2  SpO2 98%  LMP 11/23/2012  Physical Exam: General: Alert and awake oriented x3 not in any acute distress. HEENT: anicteric sclera, pupils reactive to light and accommodation CVS: S1-S2 clear no murmur rubs or gallops Chest: clear to auscultation  bilaterally, no wheezing rales or rhonchi Abdomen: soft nontender, nondistended, normal bowel sounds, no organomegaly Extremities: no cyanosis, clubbing or edema noted bilaterally Neuro: Cranial nerves II-XII intact, no focal neurological deficits   The results of significant diagnostics from this hospitalization (including imaging, microbiology, ancillary and laboratory) are listed below for reference.    LAB RESULTS: Basic Metabolic Panel:  Recent Labs Lab 12/25/12 1913 12/26/12 0500  NA 139 141  K 3.8 3.5  CL 101 104  CO2 25 26  GLUCOSE 102* 88  BUN 22 17  CREATININE 0.85 0.86  CALCIUM 10.3 10.0  MG  --  2.3  PHOS  --   3.8   Liver Function Tests:  Recent Labs Lab 12/25/12 1913 12/26/12 0500  AST 19 15  ALT 24 21  ALKPHOS 58 50  BILITOT 0.3 0.5  PROT 8.8* 7.9  ALBUMIN 4.6 4.2   CBC:  Recent Labs Lab 12/25/12 1913 12/26/12 0500  WBC 9.0 8.6  NEUTROABS 6.4  --   HGB 16.2* 15.4*  HCT 44.4 43.4  MCV 85.5 85.4  PLT 366 341   Cardiac Enzymes:  Recent Labs Lab 12/26/12 0810 12/26/12 1255  TROPONINI <0.30 <0.30    Significant Diagnostic Studies:  Dg Chest 2 View  12/25/2012  *RADIOLOGY REPORT*  Clinical Data: Chest pain  CHEST - 2 VIEW  Comparison: None.  Findings: Lungs are clear.  No pleural effusion or pneumothorax.  Cardiomediastinal silhouette is within normal limits.  Mild degenerative changes of the visualized thoracolumbar spine.  IMPRESSION: No evidence of acute cardiopulmonary disease.   Original Report Authenticated By: Charline Bills, M.D.    Nm Myocar Multi W/spect W/wall Motion / Ef  12/26/2012   IMPRESSION:  1. Negative for pharmacologic-stress induced ischemia.  2. Left ventricular ejection fraction 77%.   Original Report Authenticated By: Charline Bills, M.D.     2D ECHO:   Disposition and Follow-up: Discharge Orders   Future Orders Complete By Expires     Diet - low sodium heart healthy  As directed     Increase activity slowly  As directed         DISPOSITION: home  DIET: hear healthy diet  ACTIVITY: as tolerated   DISCHARGE FOLLOW-UP Follow-up Information   Follow up with Carollee Herter, MD. Schedule an appointment as soon as possible for a visit in 2 weeks. (for hospital follow-up)    Contact information:   [address] [place] [address] [phone]       Time spent on Discharge: 35 mins  Signed:   Beckie Viscardi M.D. [facility] 12/26/2012, 2:45 PM Pager: [phone]

## 2012-12-26 NOTE — Progress Notes (Signed)
Utilization review completed.  

## 2012-12-29 ENCOUNTER — Encounter: Payer: Self-pay | Admitting: Family Medicine

## 2012-12-29 ENCOUNTER — Ambulatory Visit (INDEPENDENT_AMBULATORY_CARE_PROVIDER_SITE_OTHER): Payer: BC Managed Care – PPO | Admitting: Family Medicine

## 2012-12-29 VITALS — BP 118/80 | HR 64 | Wt 170.0 lb

## 2012-12-29 NOTE — Patient Instructions (Signed)
Get the echo done and then do 2 Aleve 3 times per day and if that doesn't work then try switching to Prilosec 1 pill twice a day. Start on the Lipitor but if you have any difficulties with symptoms call me. Followup on Lipitor will be 2 months

## 2012-12-29 NOTE — Progress Notes (Signed)
  Subjective:    Patient ID: Debra Banks, female    DOB: 1955-11-16, 57 y.o.   MRN: [id number]  HPI She is here for a recheck. She was recently evaluated in the hospital for evaluation of left-sided chest tightness as well as radiation into her neck and into her shoulder. The medical record was reviewed. She has subsequently seen Dr. Jacinto Halim and apparently is scheduled for an echocardiogram. He did recommend she take 2 Aleve 3 times per day to see if this would help. She presently is also taking Zantac 150 mg twice a day. She does not give any reflux type symptoms. She does complain of intermittent chest tightness however cannot relate this to physical activity, breathing, position.she was also given Lipitor but has not started taking this yet. Review of her history indicates grandparents having heart disease in their late 82s however first degree relatives have no issues.  Review of Systems     Objective:   Physical Exam Alert and in no distress otherwise not examined       Assessment & Plan:  Chest tightness I discussed the chest tightness with her. She will continue on Aleve and if no benefit, switch to Prilosec 20 mg twice per day. Also discussed starting on a statin drug and the benefits cardiovascular as well as CNS. Recommend following up. Several months.

## 2012-12-31 ENCOUNTER — Telehealth: Payer: Self-pay | Admitting: Family Medicine

## 2012-12-31 NOTE — Telephone Encounter (Signed)
Lesions are separate. Have her schedule an appointment to followup on this and keep track of continued difficulty with greasy foods.

## 2012-12-31 NOTE — Telephone Encounter (Signed)
Called pt informed her word for word     Status: Signed            Lesions are separate. Have her schedule an appointment to followup on this and keep track of continued difficulty with greasy foods

## 2013-01-05 ENCOUNTER — Ambulatory Visit: Payer: BC Managed Care – PPO | Admitting: Family Medicine

## 2013-01-19 ENCOUNTER — Encounter: Payer: Self-pay | Admitting: Nurse Practitioner

## 2013-01-21 ENCOUNTER — Encounter: Payer: Self-pay | Admitting: Nurse Practitioner

## 2013-01-21 ENCOUNTER — Ambulatory Visit (INDEPENDENT_AMBULATORY_CARE_PROVIDER_SITE_OTHER): Payer: BC Managed Care – PPO | Admitting: Nurse Practitioner

## 2013-01-21 ENCOUNTER — Telehealth: Payer: Self-pay | Admitting: Nurse Practitioner

## 2013-01-21 ENCOUNTER — Ambulatory Visit (INDEPENDENT_AMBULATORY_CARE_PROVIDER_SITE_OTHER): Payer: BC Managed Care – PPO | Admitting: Family Medicine

## 2013-01-21 VITALS — BP 112/68 | HR 70 | Resp 16 | Ht 64.5 in | Wt 170.0 lb

## 2013-01-21 VITALS — BP 112/70 | Wt 170.0 lb

## 2013-01-21 DIAGNOSIS — Z0000 Encounter for general adult medical examination without abnormal findings: Secondary | ICD-10-CM

## 2013-01-21 DIAGNOSIS — R6889 Other general symptoms and signs: Secondary | ICD-10-CM

## 2013-01-21 DIAGNOSIS — Z124 Encounter for screening for malignant neoplasm of cervix: Secondary | ICD-10-CM

## 2013-01-21 DIAGNOSIS — R7989 Other specified abnormal findings of blood chemistry: Secondary | ICD-10-CM

## 2013-01-21 DIAGNOSIS — N950 Postmenopausal bleeding: Secondary | ICD-10-CM

## 2013-01-21 LAB — POCT URINALYSIS DIPSTICK
Glucose, UA: NEGATIVE
Leukocytes, UA: NEGATIVE
Nitrite, UA: NEGATIVE
Protein, UA: NEGATIVE
Urobilinogen, UA: NEGATIVE

## 2013-01-21 LAB — TSH: TSH: 1.718 u[IU]/mL (ref 0.350–4.500)

## 2013-01-21 NOTE — Telephone Encounter (Signed)
Patient informed that discussed care and AUB with Dr. Hyacinth Meeker and she needs PUS and maybe endo biopsy.  Info is sent to insurance

## 2013-01-21 NOTE — Progress Notes (Signed)
  Subjective:    Patient ID: Debra Banks, female    DOB: 21-Aug-1956, 57 y.o.   MRN: [id number]  HPI She is here for recheck. She is still having some difficulty with left-sided chest pressure. She cannot associate this with position, physical activity, food. She recently had a thorough cardiology evaluation which was essentially negative.he still has concerns over this is coming from. Review of record indicates a slightly elevated TSH. Explained to her that this would not be contributing to her chest tightness.   Review of Systems     Objective:   Physical Exam Alert and in no distress otherwise not examined       Assessment & Plan:  Abnormal TSH - Plan: TSH I will followup on abnormal TSH. Also discussed the chest tightness with her. Explained that there is nothing to be concerned about in terms of her cardiac condition. Encouraged her to keep track of her symptoms in regard to what, when, where and why to see if there is any pattern to this.

## 2013-01-21 NOTE — Progress Notes (Signed)
57 y.o. Debra Banks [id number] here for annual exam.  Pt. was on bio identical HRT since 05/2010  then went off X 6 ms. middle of last year.  After stopping had 2 mos of no vaginal bleeding then had 4 months of spotting.  Then went back on bio-identical HRT with regular cycles that was heavy X 3 months.  Now end of Feb 21. had testosterone pellets inserted and prometrium 200mg  daily po. No estrogen secondary to bleeding.  No cycles since.  1 week later had left "chest pressure" and went to ER 2/28 and had normal EKG X 3 , nuclear stress test 3/1/ and . 3/18 had normal ECHO. Still having sensation of pressure under left breast to radiaton to left arm. Will not be having testosterone pellets again. Same partner X 3 years  Patient's last menstrual period was 12/07/2012.         X 3 Sexually active: yes  The current method of family planning is Banks partner.    Exercising: no  none currently Last mammogram: 08/2012 Last pap: 12/2011 Last BMD:  Alcohol: Tobacco:   Health Maintenance  Topic Date Due  . Mammogram  09/19/2008  . Tetanus/tdap  10/28/2009  . Influenza Vaccine  06/28/2012  . Pap Smear  01/21/2015  . Colonoscopy  08/25/2021    Family History  Problem Relation Age of Onset  . Hypertension Father   . Cancer Father   . Hypertension Sister   . Hyperlipidemia Mother   . Hypertension Mother   . Heart disease Maternal Grandmother   . Heart disease Maternal Grandfather   . Heart disease Paternal Grandfather     Patient Active Problem List  Diagnosis  . Chest tightness  . Allergic rhinitis  . Essential hypertension, benign  . hyperlipidemia  . GERD     Past Medical History  Diagnosis Date  . Hypertension   . Allergy     RHINITIS  . Migraines     with aura  none in 3-4 yrs    Past Surgical History  Procedure Laterality Date  . Laparoscopy      age 64 to r/o ovarian cyst  . Labial revision  12/2002    secondary to hypertrophy    Allergies: Augmentin and  Codeine  Current Outpatient Prescriptions  Medication Sig Dispense Refill  . aspirin EC 81 MG tablet Take 243 mg by mouth once.      Marland Kitchen atorvastatin (LIPITOR) 20 MG tablet Take 1 tablet (20 mg total) by mouth daily at 6 PM.  30 tablet  3  . Cholecalciferol (VITAMIN D-3 PO) Take 1 tablet by mouth daily.      . fish oil-omega-3 fatty acids 1000 MG capsule Take 1 g by mouth daily.      Marland Kitchen levocetirizine (XYZAL) 5 MG tablet Take 5 mg by mouth every evening.      Marland Kitchen lisinopril-hydrochlorothiazide (PRINZIDE,ZESTORETIC) 10-12.5 MG per tablet Take 1 tablet by mouth daily.  90 tablet  1  . Magnesium 500 MG TABS Take 1 tablet by mouth daily.      . Nutritional Supplements (SOYPRO PO) Take by mouth.      Marland Kitchen omeprazole (PRILOSEC OTC) 20 MG tablet Take 20 mg by mouth daily.      . progesterone (PROMETRIUM) 200 MG capsule Take 200 mg by mouth daily.      . ranitidine (ZANTAC) 150 MG tablet Take 150 mg by mouth daily.      . Testosterone 75 MG PLLT 1 each  by Implant route once.      . vitamin E 1000 UNIT capsule Take 1,000 Units by mouth daily.       No current facility-administered medications for this visit.    ROS: A comprehensive review of systems was negative.  Exam:    BP 112/68  Pulse 70  Resp 16  Ht 5' 4.5" (1.638 m)  Wt 170 lb (77.111 kg)  BMI 28.74 kg/m2  LMP 12/07/2012 Weight change: @WEIGHTCHANGE @ Last 3 height recordings:  Ht Readings from Last 3 Encounters:  01/21/13 5' 4.5" (1.638 m)  12/26/12 5\' 5"  (1.651 m)   General appearance: alert, cooperative and appears stated age Head: Normocephalic, without obvious abnormality Neck: no adenopathy, no carotid bruit, no JVD, supple, symmetrical, trachea midline and thyroid not enlarged, symmetric, no tenderness/mass/nodules Lungs: clear to auscultation bilaterally Breasts: normal appearance, no masses or tenderness Heart: regular rate and rhythm, S1, S2 normal, no murmur, click, rub or gallop Abdomen: soft, non-tender; bowel sounds  normal; no masses,  no organomegaly Extremities: extremities normal, atraumatic, no cyanosis or edema Skin: Skin color, texture, turgor normal. No rashes or lesions Lymph nodes: Cervical, supraclavicular, and axillary nodes normal. no inguinal nodes palpated Neurologic: Grossly normal   Pelvic: External genitalia:  no lesions              Urethra: normal appearing urethra with no masses, tenderness or lesions              Bartholins and Skenes: normal                 Vagina: normal appearing vagina with normal color and discharge, no lesions              Cervix: normal appearance              Pap taken: no        Bimanual Exam:  Uterus:  uterus is normal size, shape, consistency and nontender                                      Adnexa:    normal adnexa in size, nontender and no masses                                      Rectovaginal: Confirms                                      Anus:  normal sphincter tone, no lesions  A:  GYN well woman exam     P:   mammogram due 08/2013  pap smear as per guidelines  return annually or prn    Will consult with MD as to further evaluation of AUB    An After Visit Summary was printed and given to the patient.

## 2013-01-21 NOTE — Patient Instructions (Addendum)
Abnormal Uterine Bleeding Abnormal uterine bleeding can have many causes. Some cases are simply treated, while others are more serious. There are several kinds of bleeding that is considered abnormal, including:  Bleeding between periods.  Bleeding after sexual intercourse.  Spotting anytime in the menstrual cycle.  Bleeding heavier or more than normal.  Bleeding after menopause. CAUSES  There are many causes of abnormal uterine bleeding. It can be present in teenagers, pregnant women, women during their reproductive years, and women who have reached menopause. Your caregiver will look for the more common causes depending on your age, signs, symptoms and your particular circumstance. Most cases are not serious and can be treated. Even the more serious causes, like cancer of the female organs, can be treated adequately if found in the early stages. That is why all types of bleeding should be evaluated and treated as soon as possible. DIAGNOSIS  Diagnosing the cause may take several kinds of tests. Your caregiver may:  Take a complete history of the type of bleeding.  Perform a complete physical exam and Pap smear.  Take an ultrasound on the abdomen showing a picture of the female organs and the pelvis.  Inject dye into the uterus and Fallopian tubes and X-ray them (hysterosalpingogram).  Place fluid in the uterus and do an ultrasound (sonohysterogrqphy).  Take a CT scan to examine the female organs and pelvis.  Take an MRI to examine the female organs and pelvis. There is no X-ray involved with this procedure.  Look inside the uterus with a telescope that has a light at the end (hysteroscopy).  Scrap the inside of the uterus to get tissue to examine (Dilatation and Curettage, D&C).  Look into the pelvis with a telescope that has a light at the end (laparoscopy). This is done through a very small cut (incision) in the abdomen. TREATMENT  Treatment will depend on the cause of the  abnormal bleeding. It can include:  Doing nothing to allow the problem to take care of itself over time.  Hormone treatment.  Birth control pills.  Treating the medical condition causing the problem.  Laparoscopy.  Major or minor surgery  Destroying the lining of the uterus with electrical currant, laser, freezing or heat (uterine ablation). HOME CARE INSTRUCTIONS   Follow your caregiver's recommendation on how to treat your problem.  See your caregiver if you missed a menstrual period and think you may be pregnant.  If you are bleeding heavily, count the number of pads/tampons you use and how often you have to change them. Tell this to your caregiver.  Avoid sexual intercourse until the problem is controlled. SEEK MEDICAL CARE IF:   You have any kind of abnormal bleeding mentioned above.  You feel dizzy at times.  You are 57 years old and have not had a menstrual period yet. SEEK IMMEDIATE MEDICAL CARE IF:   You pass out.  You are changing pads/tampons every 15 to 30 minutes.  You have belly (abdominal) pain.  You have a temperature of 100 F (37.8 C) or higher.  You become sweaty or weak.  You are passing large blood clots from the vagina.  You start to feel sick to your stomach (nauseous) and throw up (vomit). Document Released: 10/14/2005 Document Revised: 01/06/2012 Document Reviewed: 03/09/2009 [facility] Patient Information 2013 [place], [place].    Next pap due in 1 year Next Mammogram due:  111/2014 Examine your breast Monthly Take a Women's multivitamin Take 1200 mg. of calcium daily - prefer dietary  If any concerns in interim to call backNext pap due in 1 year   WE will be calling you with any recommendations following consult with MD

## 2013-01-22 NOTE — Progress Notes (Signed)
Kennon Rounds, PUS and Endobx were ordered for this patient

## 2013-01-25 ENCOUNTER — Ambulatory Visit: Payer: BC Managed Care – PPO | Admitting: Cardiovascular Disease

## 2013-01-26 ENCOUNTER — Other Ambulatory Visit: Payer: Self-pay | Admitting: *Deleted

## 2013-01-26 DIAGNOSIS — N950 Postmenopausal bleeding: Secondary | ICD-10-CM

## 2013-01-27 ENCOUNTER — Other Ambulatory Visit: Payer: Self-pay | Admitting: Obstetrics and Gynecology

## 2013-01-27 ENCOUNTER — Other Ambulatory Visit: Payer: Self-pay

## 2013-01-27 ENCOUNTER — Ambulatory Visit (INDEPENDENT_AMBULATORY_CARE_PROVIDER_SITE_OTHER): Payer: BC Managed Care – PPO | Admitting: Obstetrics and Gynecology

## 2013-01-27 ENCOUNTER — Ambulatory Visit (INDEPENDENT_AMBULATORY_CARE_PROVIDER_SITE_OTHER): Payer: BC Managed Care – PPO

## 2013-01-27 ENCOUNTER — Encounter: Payer: Self-pay | Admitting: Obstetrics and Gynecology

## 2013-01-27 VITALS — BP 106/68 | Ht 65.0 in | Wt 170.0 lb

## 2013-01-27 DIAGNOSIS — N950 Postmenopausal bleeding: Secondary | ICD-10-CM

## 2013-01-27 NOTE — Patient Instructions (Signed)
Postmenopausal Bleeding Menopause is commonly referred to as the "change in life." It is a time when the fertile years, the time of ovulating and having menstrual periods, has come to an end. It is also determined by not having menstrual periods for 12 months.  Postmenopausal bleeding is any bleeding a woman has after she has entered into menopause. Any type of postmenopausal bleeding, even if it appears to be a typical menstrual period, is concerning. This should be evaluated by your caregiver.  CAUSES   Hormone therapy.  Cancer of the cervix or cancer of the lining of the uterus (endometrial cancer).  Thinning of the uterine lining (uterine atrophy).  Thyroid diseases.  Certain medicines.  Infection of the uterus or cervix.  Inflammation or irritation of the uterine lining (endometritis).  Estrogen-secreting tumors.  Growths (polyps) on the cervix, uterine lining, or uterus.  Uterine tumors (fibroids).  Being very overweight (obese). DIAGNOSIS  Your caregiver will take a medical history and ask questions. A physical exam will also be performed. Further tests may include:   A transvaginal ultrasound. An ultrasound wand or probe is inserted into your vagina to view the pelvic organs.  A biopsy of the lining of the uterus (endometrium). A sample of the endometrium is removed and examined.  A hysteroscopy. Your caregiver may use an instrument with a light and a camera attached to it (hysteroscope). The hysteroscope is used to look inside the uterus for problems.  A dilation and curettage (D&C). Tissue is removed from the uterine lining to be examined for problems. TREATMENT  Treatment depends on the cause of the bleeding. Some treatments include:   Surgery.  Medicines.  Hormones.  A hysteroscopy or D&C to remove polyps or fibroids.  Changing or stopping a current medicine you are taking. Talk to your caregiver about your specific treatment. HOME CARE INSTRUCTIONS    Maintain a healthy weight.  Keep regular pelvic exams and Pap tests. SEEK MEDICAL CARE IF:   You have bleeding, even if it is light in comparison to your previous periods.  Your bleeding lasts more than 1 week.  You have abdominal pain.  You develop bleeding with sexual intercourse. SEEK IMMEDIATE MEDICAL CARE IF:   You have a fever, chills, headache, dizziness, muscle aches, and bleeding.  You have severe pain with bleeding.  You are passing blood clots.  You have bleeding and need more than 1 pad an hour.  You feel faint. MAKE SURE YOU:  Understand these instructions.  Will watch your condition.  Will get help right away if you are not doing well or get worse. Document Released: 01/22/2006 Document Revised: 01/06/2012 Document Reviewed: 06/20/2011 [facility] Patient Information 2013 [place], [place].  Endometrial Biopsy This is a test in which a tissue sample (a biopsy) is taken from inside the uterus (womb). It is then looked at by a specialist under a microscope to see if the tissue is normal or abnormal. The endometrium is the lining of the uterus. This test helps determine where you are in your menstrual cycle and how hormone levels are affecting the lining of the uterus. Another use for this test is to diagnose endometrial cancer, tuberculosis, polyps, or inflammatory conditions and to evaluate uterine bleeding. PREPARATION FOR TEST No preparation or fasting is necessary. NORMAL FINDINGS No pathologic conditions. Presence of "secretory-type" endometrium 3 to 5 days before to normal menstruation. Ranges for normal findings may vary among different laboratories and hospitals. You should always check with your doctor after having lab  work or other tests done to discuss the meaning of your test results and whether your values are considered within normal limits. MEANING OF TEST  Your caregiver will go over the test results with you and discuss the importance and meaning  of your results, as well as treatment options and the need for additional tests if necessary. OBTAINING THE TEST RESULTS It is your responsibility to obtain your test results. Ask the lab or department performing the test when and how you will get your results. Document Released: 02/14/2005 Document Revised: 01/06/2012 Document Reviewed: 09/23/2008 [facility] Patient Information 2013 [place], [place].  Hormone Therapy At menopause, your body begins making less estrogen and progesterone hormones. This causes the body to stop having menstrual periods. This is because estrogen and progesterone hormones control your periods and menstrual cycle. A lack of estrogen may cause symptoms such as:  Hot flushes (or hot flashes).  Vaginal dryness.  Dry skin.  Loss of sex drive.  Risk of bone loss (osteoporosis). When this happens, you may choose to take hormone therapy to get back the estrogen lost during menopause. When the hormone estrogen is given alone, it is usually referred to as ET (Estrogen Therapy). When the hormone progestin is combined with estrogen, it is generally called HT (Hormone Therapy). This was formerly known as hormone replacement therapy (HRT). Your caregiver can help you make a decision on what will be best for you. The decision to use HT seems to change often as new studies are done. Many studies do not agree on the benefits of hormone replacement therapy. LIKELY BENEFITS OF HT INCLUDE PROTECTION FROM:  Hot Flushes (also called hot flashes) - A hot flush is a sudden feeling of heat that spreads over the face and body. The skin may redden like a blush. It is connected with sweats and sleep disturbance. Women going through menopause may have hot flushes a few times a month or several times per day depending on the woman.  Osteoporosis (bone loss)- Estrogen helps guard against bone loss. After menopause, a woman's bones slowly lose calcium and become weak and brittle. As a result, bones  are more likely to break. The hip, wrist, and spine are affected most often. Hormone therapy can help slow bone loss after menopause. Weight bearing exercise and taking calcium with vitamin D also can help prevent bone loss. There are also medications that your caregiver can prescribe that can help prevent osteoporosis.  Vaginal Dryness - Loss of estrogen causes changes in the vagina. Its lining may become thin and dry. These changes can cause pain and bleeding during sexual intercourse. Dryness can also lead to infections. This can cause burning and itching. (Vaginal estrogen treatment can help relieve pain, itching, and dryness.)  Urinary Tract Infections are more common after menopause because of lack of estrogen. Some women also develop urinary incontinence because of low estrogen levels in the vagina and bladder.  Possible other benefits of estrogen include a positive effect on mood and short-term memory in women. RISKS AND COMPLICATIONS  Using estrogen alone without progesterone causes the lining of the uterus to grow. This increases the risk of lining of the uterus (endometrial) cancer. Your caregiver should give another hormone called progestin if you have a uterus.  Women who take combined (estrogen and progestin) HT appear to have an increased risk of breast cancer. The risk appears to be small, but increases throughout the time that HT is taken.  Combined therapy also makes the breast tissue slightly denser  which makes it harder to read mammograms (breast X-rays).  Combined, estrogen and progesterone therapy can be taken together every day, in which case there may be spotting of blood. HT therapy can be taken cyclically in which case you will have menstrual periods. Cyclically means HT is taken for a set amount of days, then not taken, then this process is repeated.  HT may increase the risk of stroke, heart attack, breast cancer and forming blood clots in your leg.  Transdermal  estrogen (estrogen that is absorbed through the skin with a patch or a cream) may have more positive results with:  Cholesterol.  Blood pressure.  Blood clots. Having the following conditions may indicate you should not have HT:  Endometrial cancer.  Liver disease.  Breast cancer.  Heart disease.  History of blood clots.  Stroke. TREATMENT   If you choose to take HT and have a uterus, usually estrogen and progestin are prescribed.  Your caregiver will help you decide the best way to take the medications.  Possible ways to take estrogen include:  Pills.  Patches.  Gels.  Sprays.  Vaginal estrogen cream, rings and tablets.  It is best to take the lowest dose possible that will help your symptoms and take them for the shortest period of time that you can.  Hormone therapy can help relieve some of the problems (symptoms) that affect women at menopause. Before making a decision about HT, talk to your caregiver about what is best for you. Be well informed and comfortable with your decisions. HOME CARE INSTRUCTIONS   Follow your caregivers advice when taking the medications.  A Pap test is done to screen for cervical cancer.  The first Pap test should be done at age 62.  Between ages 45 and 59, Pap tests are repeated every 2 years.  Beginning at age 64, you are advised to have a Pap test every 3 years as long as your past 3 Pap tests have been normal.  Some women have medical problems that increase the chance of getting cervical cancer. Talk to your caregiver about these problems. It is especially important to talk to your caregiver if a new problem develops soon after your last Pap test. In these cases, your caregiver may recommend more frequent screening and Pap tests.  The above recommendations are the same for women who have or have not gotten the vaccine for HPV (Human Papillomavirus).  If you had a hysterectomy for a problem that was not a cancer or a condition  that could lead to cancer, then you no longer need Pap tests. However, even if you no longer need a Pap test, a regular exam is a good idea to make sure no other problems are starting.   If you are between ages 43 and 38, and you have had normal Pap tests going back 10 years, you no longer need Pap tests. However, even if you no longer need a Pap test, a regular exam is a good idea to make sure no other problems are starting.   If you have had past treatment for cervical cancer or a condition that could lead to cancer, you need Pap tests and screening for cancer for at least 20 years after your treatment.  If Pap tests have been discontinued, risk factors (such as a new sexual partner) need to be re-assessed to determine if screening should be resumed.  Some women may need screenings more often if they are at high risk for cervical cancer.  Get mammograms done as per the advice of your caregiver. SEEK IMMEDIATE MEDICAL CARE IF:  You develop abnormal vaginal bleeding.  You have pain or swelling in your legs, shortness of breath, or chest pain.  You develop dizziness or headaches.  You have lumps or changes in your breasts or armpits.  You have slurred speech.  You develop weakness or numbness of your arms or legs.  You have pain, burning, or bleeding when urinating.  You develop abdominal pain. Document Released: 07/13/2003 Document Revised: 01/06/2012 Document Reviewed: 10/31/2010 [facility] Patient Information 2013 [place], [place].

## 2013-01-27 NOTE — Progress Notes (Signed)
  Menopause began 3938. 57 year old Go female with bleeding.  Patient had symptoms of hot flashes in 2011 that lead to initiation of hormone therapy.  Didn't have bleeding for two years while was on progesterone and testosterone.  Then stopped all hormone therapy for 6 months.  Then reinitiated hormone treatment including estrogen and had heavy cycles.  Patient is currently on Prometrium 200 mg nightly and Testosterone pellets 75 mg once every three months.  No bleeding at all since February 10th, 2014.    Patient has developed chest pain for which patient went to the ER Dept.  Had stress test, 2 D echo, EKGs - all normal. Patient is considering stopping all hormone therapy.  Procedure - Endometrial Biopsy Consent obtained.  Speculum in vagina.  Sterile prep with Hibiclens.  Paracervical block with 1 % lidocaine.  Pipelle passed to 9 cm twice.  Tissue to pathology.  No complications.   Assessment  Bleeding on hormonal therapy.  Plan  Follow up endometrial biopsy.  Will call patient with results.

## 2013-01-29 ENCOUNTER — Encounter: Payer: Self-pay | Admitting: Family Medicine

## 2013-02-01 LAB — IPS CERVICAL/ECC/EMB/VULVAR/VAGINAL BIOPSY

## 2013-02-02 ENCOUNTER — Other Ambulatory Visit: Payer: Self-pay | Admitting: Medical

## 2013-02-10 ENCOUNTER — Telehealth: Payer: Self-pay | Admitting: Obstetrics and Gynecology

## 2013-02-10 NOTE — Telephone Encounter (Signed)
Routed to Amanda

## 2013-02-10 NOTE — Telephone Encounter (Signed)
Pt. Notified endometrial biopsy normal.  Call if anymore bleeding.

## 2013-03-01 ENCOUNTER — Other Ambulatory Visit: Payer: Self-pay | Admitting: Medical

## 2013-03-01 NOTE — Telephone Encounter (Signed)
PATIENT NEEDS TO SCHEDULE A MEDICATION CHECK APPOINTMENT AS SOON AS POSSIBLE.

## 2013-03-25 ENCOUNTER — Encounter: Payer: Self-pay | Admitting: Family Medicine

## 2013-03-25 ENCOUNTER — Ambulatory Visit (INDEPENDENT_AMBULATORY_CARE_PROVIDER_SITE_OTHER): Payer: BC Managed Care – PPO | Admitting: Family Medicine

## 2013-03-25 VITALS — BP 114/70 | HR 70 | Wt 169.0 lb

## 2013-03-25 DIAGNOSIS — R1011 Right upper quadrant pain: Secondary | ICD-10-CM

## 2013-03-25 DIAGNOSIS — E785 Hyperlipidemia, unspecified: Secondary | ICD-10-CM

## 2013-03-25 DIAGNOSIS — Z79899 Other long term (current) drug therapy: Secondary | ICD-10-CM

## 2013-03-25 NOTE — Progress Notes (Signed)
  Subjective:    Patient ID: Debra Banks, female    DOB: 08-03-1956, 56 y.o.   MRN: [id number]  HPI She has a ten-day history of intermittent right upper quadrant pain that occurs after meals. Within an hour of eating she can get right upper quadrant pain with reflux and bloating no diarrhea. She notes that lettuce tends to cause this. Her diet is very limited in terms of fatty foods.over the last week she is also noted some warning nausea but no vomiting she is now resolved. She has tried Zantac twice a day with some benefit with her nausea.She is also here to have her lipids checked. She has been on Lipitor since March 1.she has had no muscle aches or pains from this.    Review of Systems     Objective:   Physical Exam Alert and in no distress. Cardiac exam shows regular rhythm without murmurs or gallops. Lungs are clear to auscultation. Abdominal exam shows active bowel sounds with right upper cautery tenderness, positive Murphy's sign and no Murphy's punch.       Assessment & Plan:  hyperlipidemia - Plan: Comprehensive metabolic panel, Lipid panel  Abdominal pain, right upper quadrant - Plan: US Abdomen Limited  Encounter for long-term (current) use of other medications - Plan: Comprehensive metabolic panel, Lipid panel

## 2013-03-26 ENCOUNTER — Other Ambulatory Visit: Payer: Self-pay

## 2013-03-26 LAB — LIPID PANEL
Cholesterol: 135 mg/dL (ref 0–200)
LDL Cholesterol: 72 mg/dL (ref 0–99)
Total CHOL/HDL Ratio: 2.6 Ratio
VLDL: 12 mg/dL (ref 0–40)

## 2013-03-26 LAB — COMPREHENSIVE METABOLIC PANEL
ALT: 45 U/L — ABNORMAL HIGH (ref 0–35)
AST: 28 U/L (ref 0–37)
Alkaline Phosphatase: 52 U/L (ref 39–117)
CO2: 30 mEq/L (ref 19–32)
Creat: 0.88 mg/dL (ref 0.50–1.10)
Total Bilirubin: 0.4 mg/dL (ref 0.3–1.2)

## 2013-03-26 MED ORDER — FLUTICASONE PROPIONATE 50 MCG/ACT NA SUSP: NASAL | Status: DC

## 2013-03-26 MED ORDER — ATORVASTATIN CALCIUM 20 MG PO TABS: 20.0000 mg | ORAL_TABLET | Freq: Every day | ORAL | Status: DC

## 2013-03-26 MED ORDER — LISINOPRIL-HYDROCHLOROTHIAZIDE 10-12.5 MG PO TABS: ORAL_TABLET | ORAL | Status: DC

## 2013-03-26 MED ORDER — LEVOCETIRIZINE DIHYDROCHLORIDE 5 MG PO TABS: 5.0000 mg | ORAL_TABLET | Freq: Every evening | ORAL | Status: DC

## 2013-03-26 NOTE — Progress Notes (Signed)
Quick Note:  Called pt cell # left message Labs look good. Continue present medications. ______

## 2013-03-26 NOTE — Telephone Encounter (Signed)
Sent med in 

## 2013-03-30 ENCOUNTER — Ambulatory Visit
Admission: RE | Admit: 2013-03-30 | Discharge: 2013-03-30 | Disposition: A | Discharge status: Home / Self Care | Payer: BC Managed Care – PPO | Source: Ambulatory Visit | Attending: Family Medicine | Admitting: Family Medicine

## 2013-03-30 ENCOUNTER — Other Ambulatory Visit: Payer: Self-pay

## 2013-03-30 DIAGNOSIS — R1011 Right upper quadrant pain: Secondary | ICD-10-CM

## 2013-04-02 ENCOUNTER — Other Ambulatory Visit: Payer: Self-pay | Admitting: Medical

## 2013-04-05 ENCOUNTER — Encounter (HOSPITAL_COMMUNITY)
Admission: RE | Admit: 2013-04-05 | Discharge: 2013-04-05 | Disposition: A | Discharge status: Home / Self Care | Payer: BC Managed Care – PPO | Source: Ambulatory Visit | Attending: Family Medicine | Admitting: Family Medicine

## 2013-04-05 DIAGNOSIS — R1011 Right upper quadrant pain: Secondary | ICD-10-CM | POA: Insufficient documentation

## 2013-04-05 MED ORDER — TECHNETIUM TC 99M MEBROFENIN IV KIT
5.5000 | PACK | Freq: Once | INTRAVENOUS | Status: AC | PRN
  Administered 2013-04-05: 6 via INTRAVENOUS

## 2013-04-05 NOTE — Progress Notes (Signed)
Quick Note:  Her hepatobiliary scan came back borderline. She did have symptoms of discomfort and eructation after the procedure. Based on this in the discussion with Dr. Elnoria Howard, I will refer her to general surgery for further evaluation. ______

## 2013-04-20 ENCOUNTER — Ambulatory Visit (INDEPENDENT_AMBULATORY_CARE_PROVIDER_SITE_OTHER): Payer: BC Managed Care – PPO | Admitting: Surgery

## 2013-04-20 ENCOUNTER — Encounter (HOSPITAL_COMMUNITY): Payer: Self-pay | Admitting: Pharmacy Technician

## 2013-04-20 ENCOUNTER — Encounter (INDEPENDENT_AMBULATORY_CARE_PROVIDER_SITE_OTHER): Payer: Self-pay | Admitting: Surgery

## 2013-04-20 VITALS — BP 114/68 | HR 70 | Temp 97.9°F | Resp 15 | Ht 65.0 in | Wt 166.8 lb

## 2013-04-20 DIAGNOSIS — K828 Other specified diseases of gallbladder: Secondary | ICD-10-CM

## 2013-04-20 NOTE — Progress Notes (Signed)
Patient ID: Debra Banks, female   DOB: 1956-05-03, 57 y.o.   MRN: [id number]  Chief Complaint  Patient presents with  . New Evaluation    eval RUQ pain / GB?    HPI Debra Banks is a 57 y.o. female.  Referred by Dr. Sharlot Gowda for evaluation of gallbladder disease  HPI This is a 57 year old female that presents with several months of epigastric pain. She was admitted to the hospital in February for cardiac workup which was negative. Her symptoms seem to get worse after she. She describes a lot of bloating with belching and flatulence. She gets nausea but has not vomited. Occasionally she has some diarrhea. Her pain is located mostly in her right upper quadrant but radiates through to her back.Ultrasound was normal.  HIDA scan showed an EF of 32%.  She is now referred for surgical evaluation.  Past Medical History  Diagnosis Date  . Hypertension   . Allergy     RHINITIS  . Migraines     with aura  none in 3-4 yrs    Past Surgical History  Procedure Laterality Date  . Laparoscopy      age 62 to r/o ovarian cyst  . Labial revision  12/2002    secondary to hypertrophy  . Hemorrhoid surgery  2005-2006    Family History  Problem Relation Age of Onset  . Hypertension Father   . Cancer Father   . Hypertension Sister   . Hyperlipidemia Mother   . Hypertension Mother   . Heart disease Maternal Grandmother   . Heart disease Maternal Grandfather   . Heart disease Paternal Grandfather     Social History History  Substance Use Topics  . Smoking status: Former Games developer  . Smokeless tobacco: Never Used  . Alcohol Use: 0.5 oz/week    1 drink(s) per week     Comment: 12 pack a week 2-3 days a week    Allergies  Allergen Reactions  . Augmentin (Amoxicillin-Pot Clavulanate)     Severe body aches and diarrhea will not try again.   . Codeine Nausea And Vomiting    Current Outpatient Prescriptions  Medication Sig Dispense Refill  . aspirin EC 81 MG tablet Take 243 mg by  mouth once.      Marland Kitchen atorvastatin (LIPITOR) 20 MG tablet Take 1 tablet (20 mg total) by mouth daily at 6 PM.  30 tablet  5  . fish oil-omega-3 fatty acids 1000 MG capsule Take 1 g by mouth daily.      . fluticasone (FLONASE) 50 MCG/ACT nasal spray USE TO SPRAY IN EACH NOSTRIL EVERY DAY  16 g  1  . levocetirizine (XYZAL) 5 MG tablet Take 1 tablet (5 mg total) by mouth every evening.  30 tablet  11  . lisinopril-hydrochlorothiazide (PRINZIDE,ZESTORETIC) 10-12.5 MG per tablet TAKE 1 TABLET BY MOUTH EVERY DAY  30 tablet  11  . Magnesium 500 MG TABS Take 1 tablet by mouth daily.      Marland Kitchen omeprazole (PRILOSEC) 20 MG capsule Take 20 mg by mouth daily.      . ranitidine (ZANTAC) 150 MG tablet Take 150 mg by mouth daily.       No current facility-administered medications for this visit.    Review of Systems Review of Systems  Constitutional: Negative for fever, chills and unexpected weight change.  HENT: Negative for hearing loss, congestion, sore throat, trouble swallowing and voice change.   Eyes: Negative for visual disturbance.  Respiratory: Negative  for cough and wheezing.   Cardiovascular: Negative for chest pain, palpitations and leg swelling.  Gastrointestinal: Positive for nausea, abdominal pain, diarrhea and abdominal distention. Negative for vomiting, constipation, blood in stool and anal bleeding.  Genitourinary: Negative for hematuria, vaginal bleeding and difficulty urinating.  Musculoskeletal: Negative for arthralgias.  Skin: Negative for rash and wound.  Neurological: Negative for seizures, syncope and headaches.  Hematological: Negative for adenopathy. Does not bruise/bleed easily.  Psychiatric/Behavioral: Negative for confusion.    Blood pressure 114/68, pulse 70, temperature 97.9 F (36.6 C), temperature source Temporal, resp. rate 15, height 5\' 5"  (1.651 m), weight 166 lb 12.8 oz (75.66 kg).  Physical Exam Physical Exam WDWN in NAD HEENT:  EOMI, sclera anicteric Neck:  No  masses, no thyromegaly Lungs:  CTA bilaterally; normal respiratory effort CV:  Regular rate and rhythm; no murmurs Abd:  +bowel sounds, soft, mild RUQ tenderness; no masses Ext:  Well-perfused; no edema Skin:  Warm, dry; no sign of jaundice  Data Reviewed   Chemistry      Component Value Date/Time   NA 135 03/25/2013 1044   K 4.3 03/25/2013 1044   CL 101 03/25/2013 1044   CO2 30 03/25/2013 1044   BUN 15 03/25/2013 1044   CREATININE 0.88 03/25/2013 1044   CREATININE 0.86 12/26/2012 0500      Component Value Date/Time   CALCIUM 9.6 03/25/2013 1044   ALKPHOS 52 03/25/2013 1044   AST 28 03/25/2013 1044   ALT 45* 03/25/2013 1044   BILITOT 0.4 03/25/2013 1044      *RADIOLOGY REPORT*  Clinical Data: Right upper quadrant pain, nausea  LIMITED ABDOMINAL ULTRASOUND - RIGHT UPPER QUADRANT  Comparison: None.  Findings:  Gallbladder: The gallbladder is visualized and no gallstones are  noted. There is no pain over the gallbladder with compression.  Common bile duct: The common bile duct is normal measuring 3.7 ml  in diameter distally.  Liver: The liver is echogenic and inhomogeneous consistent with  diffuse fatty infiltration. No focal abnormality is seen.  IMPRESSION:  1. Diffuse fatty infiltration liver.  2. No gallstones.  Original Report Authenticated By: Dwyane Dee, M.D.   RADIOLOGY REPORT*  Clinical Data: Right upper quadrant pain  NUCLEAR MEDICINE HEPATOBILIARY IMAGING WITH GALLBLADDER EF  Technique: Sequential images of the abdomen were obtained out to  60 minutes following intravenous administration of  radiopharmaceutical. After oral ingestion of Ensure, gallbladder  ejection fraction was determined.  Radiopharmaceutical: 5.5 mCi Tc-15m Choletec  Comparison: Ultrasound 03/30/2012  Findings: There is homogeneous radiotracer uptake within the liver.  The gallbladder begins to fill at 55 minutes. Counts are present  within the small bowel by 30 minutes. A fatty meal was   administered orally and in the gallbladder contracted minimally  with a calculated ejection fracture of 32%  Gallbladder ejection fraction: 32%. Normal gallbladder ejection  fraction with Ensure is greater than 33%.  IMPRESSION:  1. Low gallbladder ejection fraction = 32 %. Common differential  diagnosis includes biliary dyskinesia, chronic cholecystitis, and  sphincter Oddi dysfunction.  2. Patent cystic duct and common bile duct.  Original Report Authenticated By: Genevive Bi, M.D.  Assessment    Biliary dyskinesia     Plan    Laparoscopic cholecystectomy with intraoperative cholangiogram.  The surgical procedure has been discussed with the patient.  Potential risks, benefits, alternative treatments, and expected outcomes have been explained.  All of the patient's questions at this time have been answered.  The likelihood of reaching the patient's treatment  goal is good.  The patient understand the proposed surgical procedure and wishes to proceed.         Aleeha Boline K. 04/20/2013, 3:38 PM

## 2013-04-27 ENCOUNTER — Encounter (HOSPITAL_COMMUNITY)
Admission: RE | Admit: 2013-04-27 | Discharge: 2013-04-27 | Disposition: A | Discharge status: Home / Self Care | Payer: BC Managed Care – PPO | Source: Ambulatory Visit | Attending: Surgery | Admitting: Surgery

## 2013-04-27 ENCOUNTER — Encounter (HOSPITAL_COMMUNITY): Payer: Self-pay

## 2013-04-27 HISTORY — DX: Nausea with vomiting, unspecified: R11.2

## 2013-04-27 HISTORY — DX: Gastro-esophageal reflux disease without esophagitis: K21.9

## 2013-04-27 HISTORY — DX: Other complications of anesthesia, initial encounter: T88.59XA

## 2013-04-27 HISTORY — DX: Other specified postprocedural states: Z98.890

## 2013-04-27 HISTORY — DX: Adverse effect of unspecified anesthetic, initial encounter: T41.45XA

## 2013-04-27 LAB — CBC
MCHC: 35.3 g/dL (ref 30.0–36.0)
Platelets: 326 10*3/uL (ref 150–400)
RDW: 13.1 % (ref 11.5–15.5)

## 2013-04-27 LAB — BASIC METABOLIC PANEL
GFR calc Af Amer: 86 mL/min — ABNORMAL LOW (ref >90)
GFR calc non Af Amer: 74 mL/min — ABNORMAL LOW (ref >90)
Potassium: 3.8 mEq/L (ref 3.5–5.1)
Sodium: 135 mEq/L (ref 135–145)

## 2013-04-27 LAB — HCG, SERUM, QUALITATIVE: Preg, Serum: NEGATIVE

## 2013-04-27 NOTE — Progress Notes (Signed)
Pt denies SOB and chest pain. Pt states, " I saw Dr. Jacinto Halim because it felt like I had an elephant on my chest and it was my gallbladder." Pt states that she had a stress test and is not sure if she had an echo. Records ( Stress test, echo and latest office notes) were requested from Dr. Jacinto Halim.

## 2013-04-29 NOTE — Progress Notes (Signed)
Called Dr. Verl Dicker office to check on the notes requested the other day.  She states she will fax an EKG, Echo and office notes now to [place]. No stress test done.

## 2013-05-03 MED ORDER — CIPROFLOXACIN IN D5W 400 MG/200ML IV SOLN
400.0000 mg | INTRAVENOUS | Status: DC
  Filled 2013-05-03: qty 200

## 2013-05-04 ENCOUNTER — Encounter (HOSPITAL_COMMUNITY): Payer: Self-pay | Admitting: Certified Registered Nurse Anesthetist

## 2013-05-04 ENCOUNTER — Ambulatory Visit (HOSPITAL_COMMUNITY)
Admission: RE | Admit: 2013-05-04 | Discharge: 2013-05-04 | Disposition: A | Discharge status: Home / Self Care | Payer: BC Managed Care – PPO | Source: Ambulatory Visit | Attending: Surgery | Admitting: Surgery

## 2013-05-04 ENCOUNTER — Encounter (HOSPITAL_COMMUNITY)
Admission: RE | Disposition: A | Discharge status: Home / Self Care | Payer: Self-pay | Source: Ambulatory Visit | Attending: Surgery

## 2013-05-04 ENCOUNTER — Ambulatory Visit (HOSPITAL_COMMUNITY): Payer: BC Managed Care – PPO | Admitting: Anesthesiology

## 2013-05-04 ENCOUNTER — Encounter (HOSPITAL_COMMUNITY): Payer: Self-pay | Admitting: Anesthesiology

## 2013-05-04 DIAGNOSIS — K828 Other specified diseases of gallbladder: Secondary | ICD-10-CM

## 2013-05-04 DIAGNOSIS — Z7982 Long term (current) use of aspirin: Secondary | ICD-10-CM | POA: Insufficient documentation

## 2013-05-04 DIAGNOSIS — Z79899 Other long term (current) drug therapy: Secondary | ICD-10-CM | POA: Insufficient documentation

## 2013-05-04 DIAGNOSIS — Z885 Allergy status to narcotic agent status: Secondary | ICD-10-CM | POA: Insufficient documentation

## 2013-05-04 DIAGNOSIS — J309 Allergic rhinitis, unspecified: Secondary | ICD-10-CM | POA: Insufficient documentation

## 2013-05-04 DIAGNOSIS — I10 Essential (primary) hypertension: Secondary | ICD-10-CM | POA: Insufficient documentation

## 2013-05-04 DIAGNOSIS — K7689 Other specified diseases of liver: Secondary | ICD-10-CM | POA: Insufficient documentation

## 2013-05-04 DIAGNOSIS — K819 Cholecystitis, unspecified: Secondary | ICD-10-CM | POA: Insufficient documentation

## 2013-05-04 DIAGNOSIS — K811 Chronic cholecystitis: Secondary | ICD-10-CM

## 2013-05-04 DIAGNOSIS — G43109 Migraine with aura, not intractable, without status migrainosus: Secondary | ICD-10-CM | POA: Insufficient documentation

## 2013-05-04 DIAGNOSIS — Z87891 Personal history of nicotine dependence: Secondary | ICD-10-CM | POA: Insufficient documentation

## 2013-05-04 DIAGNOSIS — Z880 Allergy status to penicillin: Secondary | ICD-10-CM | POA: Insufficient documentation

## 2013-05-04 HISTORY — PX: CHOLECYSTECTOMY: SHX55

## 2013-05-04 SURGERY — LAPAROSCOPIC CHOLECYSTECTOMY
Anesthesia: General | Site: Abdomen | Wound class: Clean Contaminated

## 2013-05-04 MED ORDER — NEOSTIGMINE METHYLSULFATE 1 MG/ML IJ SOLN
INTRAMUSCULAR | Status: DC | PRN
  Administered 2013-05-04: 3 mg via INTRAVENOUS

## 2013-05-04 MED ORDER — SODIUM CHLORIDE 0.9 % IR SOLN
Status: DC | PRN
  Administered 2013-05-04: 1

## 2013-05-04 MED ORDER — HYDROMORPHONE HCL PF 1 MG/ML IJ SOLN
INTRAMUSCULAR | Status: AC
  Administered 2013-05-04: 0.25 mg via INTRAVENOUS
  Filled 2013-05-04: qty 1

## 2013-05-04 MED ORDER — CEFAZOLIN SODIUM-DEXTROSE 2-3 GM-% IV SOLR
INTRAVENOUS | Status: AC
  Administered 2013-05-04: 2 g via INTRAVENOUS
  Filled 2013-05-04: qty 50

## 2013-05-04 MED ORDER — SODIUM CHLORIDE 0.9 % IV SOLN: INTRAVENOUS | Status: DC | PRN

## 2013-05-04 MED ORDER — ONDANSETRON HCL 4 MG/2ML IJ SOLN: 4.0000 mg | INTRAMUSCULAR | Status: DC | PRN

## 2013-05-04 MED ORDER — SCOPOLAMINE 1 MG/3DAYS TD PT72
1.0000 | MEDICATED_PATCH | TRANSDERMAL | Status: DC
  Administered 2013-05-04: 1.5 mg via TRANSDERMAL

## 2013-05-04 MED ORDER — LIDOCAINE HCL (CARDIAC) 20 MG/ML IV SOLN
INTRAVENOUS | Status: DC | PRN
  Administered 2013-05-04: 70 mg via INTRAVENOUS

## 2013-05-04 MED ORDER — ACETAMINOPHEN 10 MG/ML IV SOLN: 1000.0000 mg | Freq: Once | INTRAVENOUS | Status: DC | PRN

## 2013-05-04 MED ORDER — PROMETHAZINE HCL 12.5 MG PO TABS: 12.5000 mg | ORAL_TABLET | Freq: Four times a day (QID) | ORAL | Status: DC | PRN

## 2013-05-04 MED ORDER — LACTATED RINGERS IV SOLN
INTRAVENOUS | Status: DC | PRN
  Administered 2013-05-04 (×2): via INTRAVENOUS

## 2013-05-04 MED ORDER — MIDAZOLAM HCL 5 MG/5ML IJ SOLN
INTRAMUSCULAR | Status: DC | PRN
  Administered 2013-05-04: 2 mg via INTRAVENOUS

## 2013-05-04 MED ORDER — OXYCODONE-ACETAMINOPHEN 5-325 MG PO TABS: 1.0000 | ORAL_TABLET | ORAL | Status: DC | PRN

## 2013-05-04 MED ORDER — BUPIVACAINE-EPINEPHRINE 0.25% -1:200000 IJ SOLN
INTRAMUSCULAR | Status: DC | PRN
  Administered 2013-05-04: 30 mL

## 2013-05-04 MED ORDER — GLYCOPYRROLATE 0.2 MG/ML IJ SOLN
INTRAMUSCULAR | Status: DC | PRN
  Administered 2013-05-04: .4 mg via INTRAVENOUS

## 2013-05-04 MED ORDER — MORPHINE SULFATE 2 MG/ML IJ SOLN: 2.0000 mg | INTRAMUSCULAR | Status: DC | PRN

## 2013-05-04 MED ORDER — DEXAMETHASONE SODIUM PHOSPHATE 4 MG/ML IJ SOLN
INTRAMUSCULAR | Status: DC | PRN
  Administered 2013-05-04: 8 mg via INTRAVENOUS

## 2013-05-04 MED ORDER — FENTANYL CITRATE 0.05 MG/ML IJ SOLN
INTRAMUSCULAR | Status: DC | PRN
  Administered 2013-05-04: 100 ug via INTRAVENOUS
  Administered 2013-05-04 (×2): 50 ug via INTRAVENOUS

## 2013-05-04 MED ORDER — PROMETHAZINE HCL 25 MG/ML IJ SOLN: 6.2500 mg | INTRAMUSCULAR | Status: DC | PRN

## 2013-05-04 MED ORDER — PROPOFOL 10 MG/ML IV BOLUS
INTRAVENOUS | Status: DC | PRN
  Administered 2013-05-04: 150 mg via INTRAVENOUS

## 2013-05-04 MED ORDER — HYDROMORPHONE HCL PF 1 MG/ML IJ SOLN
0.2500 mg | INTRAMUSCULAR | Status: DC | PRN
  Administered 2013-05-04: 0.25 mg via INTRAVENOUS

## 2013-05-04 MED ORDER — ONDANSETRON HCL 4 MG/2ML IJ SOLN
INTRAMUSCULAR | Status: DC | PRN
  Administered 2013-05-04 (×2): 4 mg via INTRAVENOUS

## 2013-05-04 MED ORDER — SCOPOLAMINE 1 MG/3DAYS TD PT72
MEDICATED_PATCH | TRANSDERMAL | Status: AC
  Filled 2013-05-04: qty 1

## 2013-05-04 MED ORDER — MEPERIDINE HCL 25 MG/ML IJ SOLN: 6.2500 mg | INTRAMUSCULAR | Status: DC | PRN

## 2013-05-04 MED ORDER — PROPOFOL INFUSION 10 MG/ML OPTIME
INTRAVENOUS | Status: DC | PRN
  Administered 2013-05-04: 25 ug/kg/min via INTRAVENOUS

## 2013-05-04 MED ORDER — ROCURONIUM BROMIDE 100 MG/10ML IV SOLN
INTRAVENOUS | Status: DC | PRN
  Administered 2013-05-04: 40 mg via INTRAVENOUS

## 2013-05-04 MED ORDER — LACTATED RINGERS IV SOLN
INTRAVENOUS | Status: DC
  Administered 2013-05-04: 13:00:00 via INTRAVENOUS

## 2013-05-04 SURGICAL SUPPLY — 42 items
APPLIER CLIP ROT 10 11.4 M/L (STAPLE) ×3
BENZOIN TINCTURE PRP APPL 2/3 (GAUZE/BANDAGES/DRESSINGS) ×3 IMPLANT
BLADE SURG ROTATE 9660 (MISCELLANEOUS) IMPLANT
CANISTER SUCTION 2500CC (MISCELLANEOUS) ×3 IMPLANT
CHLORAPREP W/TINT 26ML (MISCELLANEOUS) ×3 IMPLANT
CLIP APPLIE ROT 10 11.4 M/L (STAPLE) ×2 IMPLANT
CLOTH BEACON ORANGE TIMEOUT ST (SAFETY) ×3 IMPLANT
COVER MAYO STAND STRL (DRAPES) ×3 IMPLANT
COVER SURGICAL LIGHT HANDLE (MISCELLANEOUS) ×3 IMPLANT
DECANTER SPIKE VIAL GLASS SM (MISCELLANEOUS) ×6 IMPLANT
DRAPE C-ARM 42X72 X-RAY (DRAPES) ×3 IMPLANT
DRAPE UTILITY 15X26 W/TAPE STR (DRAPE) ×6 IMPLANT
DRSG TEGADERM 2-3/8X2-3/4 SM (GAUZE/BANDAGES/DRESSINGS) ×9 IMPLANT
DRSG TEGADERM 4X4.75 (GAUZE/BANDAGES/DRESSINGS) ×3 IMPLANT
ELECT REM PT RETURN 9FT ADLT (ELECTROSURGICAL) ×3
ELECTRODE REM PT RTRN 9FT ADLT (ELECTROSURGICAL) ×2 IMPLANT
FILTER SMOKE EVAC LAPAROSHD (FILTER) ×3 IMPLANT
GAUZE SPONGE 2X2 8PLY STRL LF (GAUZE/BANDAGES/DRESSINGS) IMPLANT
GLOVE BIO SURGEON STRL SZ7 (GLOVE) ×3 IMPLANT
GLOVE BIO SURGEON STRL SZ7.5 (GLOVE) ×3 IMPLANT
GLOVE BIOGEL PI IND STRL 7.5 (GLOVE) ×4 IMPLANT
GLOVE BIOGEL PI INDICATOR 7.5 (GLOVE) ×2
GOWN STRL NON-REIN LRG LVL3 (GOWN DISPOSABLE) ×12 IMPLANT
KIT BASIN OR (CUSTOM PROCEDURE TRAY) ×3 IMPLANT
KIT ROOM TURNOVER OR (KITS) ×3 IMPLANT
NS IRRIG 1000ML POUR BTL (IV SOLUTION) ×3 IMPLANT
PAD ARMBOARD 7.5X6 YLW CONV (MISCELLANEOUS) ×3 IMPLANT
POUCH SPECIMEN RETRIEVAL 10MM (ENDOMECHANICALS) ×3 IMPLANT
SCISSORS LAP 5X35 DISP (ENDOMECHANICALS) ×3 IMPLANT
SET CHOLANGIOGRAPH 5 50 .035 (SET/KITS/TRAYS/PACK) ×3 IMPLANT
SET IRRIG TUBING LAPAROSCOPIC (IRRIGATION / IRRIGATOR) ×3 IMPLANT
SLEEVE ENDOPATH XCEL 5M (ENDOMECHANICALS) ×3 IMPLANT
SPECIMEN JAR SMALL (MISCELLANEOUS) ×3 IMPLANT
SPONGE GAUZE 2X2 STER 10/PKG (GAUZE/BANDAGES/DRESSINGS)
STRIP CLOSURE SKIN 1/2X4 (GAUZE/BANDAGES/DRESSINGS) ×3 IMPLANT
SUT MNCRL AB 4-0 PS2 18 (SUTURE) ×3 IMPLANT
TOWEL OR 17X24 6PK STRL BLUE (TOWEL DISPOSABLE) ×3 IMPLANT
TOWEL OR 17X26 10 PK STRL BLUE (TOWEL DISPOSABLE) ×3 IMPLANT
TRAY LAPAROSCOPIC (CUSTOM PROCEDURE TRAY) ×3 IMPLANT
TROCAR XCEL BLUNT TIP 100MML (ENDOMECHANICALS) ×3 IMPLANT
TROCAR XCEL NON-BLD 11X100MML (ENDOMECHANICALS) ×3 IMPLANT
TROCAR XCEL NON-BLD 5MMX100MML (ENDOMECHANICALS) ×3 IMPLANT

## 2013-05-04 NOTE — Transfer of Care (Signed)
Immediate Anesthesia Transfer of Care Note  Patient: Debra Banks  Procedure(s) Performed: Procedure(s): LAPAROSCOPIC CHOLECYSTECTOMY (N/A)  Patient Location: PACU  Anesthesia Type:General  Level of Consciousness: awake and patient cooperative  Airway & Oxygen Therapy: Patient Spontanous Breathing and Patient connected to nasal cannula oxygen  Post-op Assessment: Report given to PACU RN, Post -op Vital signs reviewed and stable and Patient moving all extremities X 4  Post vital signs: Reviewed and stable  Complications: No apparent anesthesia complications

## 2013-05-04 NOTE — Anesthesia Preprocedure Evaluation (Signed)
Anesthesia Evaluation  Patient identified by MRN, date of birth, ID band Patient awake    Reviewed: Allergy & Precautions, H&P , NPO status , Patient's Chart, lab work & pertinent test results  History of Anesthesia Complications (+) PONV  Airway Mallampati: II TM Distance: >3 FB Neck ROM: Full    Dental  (+) Dental Advisory Given and Teeth Intact   Pulmonary neg pulmonary ROS,  breath sounds clear to auscultation        Cardiovascular hypertension, Pt. on medications Rhythm:Regular Rate:Normal     Neuro/Psych  Headaches, negative neurological ROS  negative psych ROS   GI/Hepatic negative GI ROS, Neg liver ROS, GERD-  Medicated,  Endo/Other  negative endocrine ROS  Renal/GU negative Renal ROS     Musculoskeletal negative musculoskeletal ROS (+)   Abdominal   Peds  Hematology negative hematology ROS (+)   Anesthesia Other Findings   Reproductive/Obstetrics negative OB ROS                           Anesthesia Physical Anesthesia Plan  ASA: II  Anesthesia Plan: General   Post-op Pain Management:    Induction: Intravenous  Airway Management Planned: Oral ETT  Additional Equipment:   Intra-op Plan:   Post-operative Plan: Extubation in OR  Informed Consent: I have reviewed the patients History and Physical, chart, labs and discussed the procedure including the risks, benefits and alternatives for the proposed anesthesia with the patient or authorized representative who has indicated his/her understanding and acceptance.   Dental advisory given  Plan Discussed with: CRNA  Anesthesia Plan Comments:         Anesthesia Quick Evaluation

## 2013-05-04 NOTE — Anesthesia Postprocedure Evaluation (Signed)
  Anesthesia Post-op Note  Patient: Debra Banks  Procedure(s) Performed: Procedure(s): LAPAROSCOPIC CHOLECYSTECTOMY (N/A)  Patient Location: PACU  Anesthesia Type:General  Level of Consciousness: awake, alert  and oriented  Airway and Oxygen Therapy: Patient Spontanous Breathing and Patient connected to nasal cannula oxygen  Post-op Pain: mild  Post-op Assessment: Post-op Vital signs reviewed, Patient's Cardiovascular Status Stable, Respiratory Function Stable, Patent Airway and No signs of Nausea or vomiting  Post-op Vital Signs: Reviewed and stable  Complications: No apparent anesthesia complications

## 2013-05-04 NOTE — Op Note (Signed)
Laparoscopic Cholecystectomy Procedure Note  Indications: This patient presents with symptomatic gallbladder disease and will undergo laparoscopic cholecystectomy.  Pre-operative Diagnosis: biliary dyskinesia   Post-operative Diagnosis: Same  Surgeon: Everline Mahaffy K.   Assistants: Dr. Emelia Loron  Anesthesia: General endotracheal anesthesia  ASA Class: 2  Procedure Details  The patient was seen again in the Holding Room. The risks, benefits, complications, treatment options, and expected outcomes were discussed with the patient. The possibilities of reaction to medication, pulmonary aspiration, perforation of viscus, bleeding, recurrent infection, finding a normal gallbladder, the need for additional procedures, failure to diagnose a condition, the possible need to convert to an open procedure, and creating a complication requiring transfusion or operation were discussed with the patient. The likelihood of improving the patient's symptoms with return to their baseline status is good.  The patient and/or family concurred with the proposed plan, giving informed consent. The site of surgery properly noted. The patient was taken to Operating Room, identified as Norval Morton and the procedure verified as Laparoscopic Cholecystectomy with Intraoperative Cholangiogram. A Time Out was held and the above information confirmed.  Prior to the induction of general anesthesia, antibiotic prophylaxis was administered. General endotracheal anesthesia was then administered and tolerated well. After the induction, the abdomen was prepped with Chloraprep and draped in sterile fashion. The patient was positioned in the supine position.  Local anesthetic agent was injected into the skin near the umbilicus and an incision made. We dissected down to the abdominal fascia with blunt dissection.  The fascia was incised vertically and we entered the peritoneal cavity bluntly.  A pursestring suture of 0-Vicryl  was placed around the fascial opening.  The Hasson cannula was inserted and secured with the stay suture.  Pneumoperitoneum was then created with CO2 and tolerated well without any adverse changes in the patient's vital signs. An 11-mm port was placed in the subxiphoid position.  Two 5-mm ports were placed in the right upper quadrant. All skin incisions were infiltrated with a local anesthetic agent before making the incision and placing the trocars.   We positioned the patient in reverse Trendelenburg, tilted slightly to the patient's left.  The gallbladder was identified, the fundus grasped and retracted cephalad. There were significant adhesions to the surface of the gallbladder.  Adhesions were lysed bluntly and with the electrocautery where indicated, taking care not to injure any adjacent organs or viscus. The infundibulum was grasped and retracted laterally, exposing the peritoneum overlying the triangle of Calot. This was then divided and exposed in a blunt fashion. The cystic duct was clearly identified and bluntly dissected circumferentially. A critical view of the cystic duct and cystic artery was obtained.  The cystic duct was then ligated with clips and divided. The cystic artery was, dissected free, ligated with clips and divided as well.   The gallbladder was dissected from the liver bed in retrograde fashion with the electrocautery. The gallbladder was removed and placed in an Endocatch sac. The liver bed was irrigated and inspected. Hemostasis was achieved with the electrocautery. Copious irrigation was utilized and was repeatedly aspirated until clear.  The gallbladder and Endocatch sac were then removed through the umbilical port site.  The pursestring suture was used to close the umbilical fascia.    We again inspected the right upper quadrant for hemostasis.  Pneumoperitoneum was released as we removed the trocars.  4-0 Monocryl was used to close the skin.   Benzoin, steri-strips, and  clean dressings were applied. The patient was then  extubated and brought to the recovery room in stable condition. Instrument, sponge, and needle counts were correct at closure and at the conclusion of the case.   Findings: Cholecystitis without Cholelithiasis  Estimated Blood Loss: Minimal         Drains: none         Specimens: Gallbladder           Complications: None; patient tolerated the procedure well.         Disposition: PACU - hemodynamically stable.         Condition: stable  Wilmon Arms. Corliss Skains, MD, [facility] Surgery  General/ Trauma Surgery  05/04/2013 2:47 PM

## 2013-05-04 NOTE — Interval H&P Note (Signed)
History and Physical Interval Note:  05/04/2013 11:16 AM  Debra Banks  has presented today for surgery, with the diagnosis of biliary dykinesia  The various methods of treatment have been discussed with the patient and family. After consideration of risks, benefits and other options for treatment, the patient has consented to  Procedure(s): LAPAROSCOPIC CHOLECYSTECTOMY WITH INTRAOPERATIVE CHOLANGIOGRAM (N/A) as a surgical intervention .  The patient's history has been reviewed, patient examined, no change in status, stable for surgery.  I have reviewed the patient's chart and labs.  Questions were answered to the patient's satisfaction.     Makhayla Mcmurry K.

## 2013-05-04 NOTE — Anesthesia Procedure Notes (Addendum)
Procedure Name: Intubation Date/Time: 05/04/2013 1:55 PM Performed by: Leona Singleton A Pre-anesthesia Checklist: Patient identified, Emergency Drugs available, Suction available and Patient being monitored Patient Re-evaluated:Patient Re-evaluated prior to inductionOxygen Delivery Method: Circle system utilized Preoxygenation: Pre-oxygenation with 100% oxygen Intubation Type: IV induction Ventilation: Mask ventilation without difficulty Laryngoscope Size: Lemanski and 2 Grade View: Grade I Tube type: Oral Tube size: 7.0 mm Number of attempts: 1 Airway Equipment and Method: Stylet Placement Confirmation: ETT inserted through vocal cords under direct vision,  positive ETCO2 and breath sounds checked- equal and bilateral Secured at: 22 cm Tube secured with: Tape Dental Injury: Teeth and Oropharynx as per pre-operative assessment

## 2013-05-04 NOTE — Preoperative (Signed)
Beta Blockers   Reason not to administer Beta Blockers:Not Applicable 

## 2013-05-04 NOTE — H&P (View-Only) (Signed)
Patient ID: Debra Banks, female   DOB: [birth year], 57 y.o.   MRN: [id number]  Chief Complaint  Patient presents with  . New Evaluation    eval RUQ pain / GB?    HPI Debra Banks is a 57 y.o. female.  Referred by Dr. Sharlot Gowda for evaluation of gallbladder disease  HPI This is a 57 year old female that presents with several months of epigastric pain. She was admitted to the hospital in February for cardiac workup which was negative. Her symptoms seem to get worse after she. She describes a lot of bloating with belching and flatulence. She gets nausea but has not vomited. Occasionally she has some diarrhea. Her pain is located mostly in her right upper quadrant but radiates through to her back.Ultrasound was normal.  HIDA scan showed an EF of 32%.  She is now referred for surgical evaluation.  Past Medical History  Diagnosis Date  . Hypertension   . Allergy     RHINITIS  . Migraines     with aura  none in 3-4 yrs    Past Surgical History  Procedure Laterality Date  . Laparoscopy      age 56 to r/o ovarian cyst  . Labial revision  12/2002    secondary to hypertrophy  . Hemorrhoid surgery  2005-2006    Family History  Problem Relation Age of Onset  . Hypertension Father   . Cancer Father   . Hypertension Sister   . Hyperlipidemia Mother   . Hypertension Mother   . Heart disease Maternal Grandmother   . Heart disease Maternal Grandfather   . Heart disease Paternal Grandfather     Social History History  Substance Use Topics  . Smoking status: Former Games developer  . Smokeless tobacco: Never Used  . Alcohol Use: 0.5 oz/week    1 drink(s) per week     Comment: 12 pack a week 2-3 days a week    Allergies  Allergen Reactions  . Augmentin (Amoxicillin-Pot Clavulanate)     Severe body aches and diarrhea will not try again.   . Codeine Nausea And Vomiting    Current Outpatient Prescriptions  Medication Sig Dispense Refill  . aspirin EC 81 MG tablet Take 243 mg by  mouth once.      Marland Kitchen atorvastatin (LIPITOR) 20 MG tablet Take 1 tablet (20 mg total) by mouth daily at 6 PM.  30 tablet  5  . fish oil-omega-3 fatty acids 1000 MG capsule Take 1 g by mouth daily.      . fluticasone (FLONASE) 50 MCG/ACT nasal spray USE TO SPRAY IN EACH NOSTRIL EVERY DAY  16 g  1  . levocetirizine (XYZAL) 5 MG tablet Take 1 tablet (5 mg total) by mouth every evening.  30 tablet  11  . lisinopril-hydrochlorothiazide (PRINZIDE,ZESTORETIC) 10-12.5 MG per tablet TAKE 1 TABLET BY MOUTH EVERY DAY  30 tablet  11  . Magnesium 500 MG TABS Take 1 tablet by mouth daily.      Marland Kitchen omeprazole (PRILOSEC) 20 MG capsule Take 20 mg by mouth daily.      . ranitidine (ZANTAC) 150 MG tablet Take 150 mg by mouth daily.       No current facility-administered medications for this visit.    Review of Systems Review of Systems  Constitutional: Negative for fever, chills and unexpected weight change.  HENT: Negative for hearing loss, congestion, sore throat, trouble swallowing and voice change.   Eyes: Negative for visual disturbance.  Respiratory: Negative  for cough and wheezing.   Cardiovascular: Negative for chest pain, palpitations and leg swelling.  Gastrointestinal: Positive for nausea, abdominal pain, diarrhea and abdominal distention. Negative for vomiting, constipation, blood in stool and anal bleeding.  Genitourinary: Negative for hematuria, vaginal bleeding and difficulty urinating.  Musculoskeletal: Negative for arthralgias.  Skin: Negative for rash and wound.  Neurological: Negative for seizures, syncope and headaches.  Hematological: Negative for adenopathy. Does not bruise/bleed easily.  Psychiatric/Behavioral: Negative for confusion.    Blood pressure 114/68, pulse 70, temperature 97.9 F (36.6 C), temperature source Temporal, resp. rate 15, height 5\' 5"  (1.651 m), weight 166 lb 12.8 oz (75.66 kg).  Physical Exam Physical Exam WDWN in NAD HEENT:  EOMI, sclera anicteric Neck:  No  masses, no thyromegaly Lungs:  CTA bilaterally; normal respiratory effort CV:  Regular rate and rhythm; no murmurs Abd:  +bowel sounds, soft, mild RUQ tenderness; no masses Ext:  Well-perfused; no edema Skin:  Warm, dry; no sign of jaundice  Data Reviewed   Chemistry      Component Value Date/Time   NA 135 03/25/2013 1044   K 4.3 03/25/2013 1044   CL 101 03/25/2013 1044   CO2 30 03/25/2013 1044   BUN 15 03/25/2013 1044   CREATININE 0.88 03/25/2013 1044   CREATININE 0.86 12/26/2012 0500      Component Value Date/Time   CALCIUM 9.6 03/25/2013 1044   ALKPHOS 52 03/25/2013 1044   AST 28 03/25/2013 1044   ALT 45* 03/25/2013 1044   BILITOT 0.4 03/25/2013 1044      *RADIOLOGY REPORT*  Clinical Data: Right upper quadrant pain, nausea  LIMITED ABDOMINAL ULTRASOUND - RIGHT UPPER QUADRANT  Comparison: None.  Findings:  Gallbladder: The gallbladder is visualized and no gallstones are  noted. There is no pain over the gallbladder with compression.  Common bile duct: The common bile duct is normal measuring 3.7 ml  in diameter distally.  Liver: The liver is echogenic and inhomogeneous consistent with  diffuse fatty infiltration. No focal abnormality is seen.  IMPRESSION:  1. Diffuse fatty infiltration liver.  2. No gallstones.  Original Report Authenticated By: Dwyane Dee, M.D.   RADIOLOGY REPORT*  Clinical Data: Right upper quadrant pain  NUCLEAR MEDICINE HEPATOBILIARY IMAGING WITH GALLBLADDER EF  Technique: Sequential images of the abdomen were obtained out to  60 minutes following intravenous administration of  radiopharmaceutical. After oral ingestion of Ensure, gallbladder  ejection fraction was determined.  Radiopharmaceutical: 5.5 mCi Tc-37m Choletec  Comparison: Ultrasound 03/30/2012  Findings: There is homogeneous radiotracer uptake within the liver.  The gallbladder begins to fill at 55 minutes. Counts are present  within the small bowel by 30 minutes. A fatty meal was   administered orally and in the gallbladder contracted minimally  with a calculated ejection fracture of 32%  Gallbladder ejection fraction: 32%. Normal gallbladder ejection  fraction with Ensure is greater than 33%.  IMPRESSION:  1. Low gallbladder ejection fraction = 32 %. Common differential  diagnosis includes biliary dyskinesia, chronic cholecystitis, and  sphincter Oddi dysfunction.  2. Patent cystic duct and common bile duct.  Original Report Authenticated By: Genevive Bi, M.D.  Assessment    Biliary dyskinesia     Plan    Laparoscopic cholecystectomy with intraoperative cholangiogram.  The surgical procedure has been discussed with the patient.  Potential risks, benefits, alternative treatments, and expected outcomes have been explained.  All of the patient's questions at this time have been answered.  The likelihood of reaching the patient's treatment  goal is good.  The patient understand the proposed surgical procedure and wishes to proceed.         Jaxten Brosh K. 04/20/2013, 3:38 PM

## 2013-05-06 ENCOUNTER — Encounter (HOSPITAL_COMMUNITY): Payer: Self-pay | Admitting: Surgery

## 2013-05-10 ENCOUNTER — Telehealth: Payer: Self-pay | Admitting: Internal Medicine

## 2013-05-10 MED ORDER — FLUTICASONE PROPIONATE 50 MCG/ACT NA SUSP: 2.0000 | Freq: Every day | NASAL | Status: DC

## 2013-05-10 MED ORDER — LEVOCETIRIZINE DIHYDROCHLORIDE 5 MG PO TABS: 5.0000 mg | ORAL_TABLET | Freq: Every morning | ORAL | Status: DC

## 2013-05-10 NOTE — Telephone Encounter (Signed)
Refill request for a 90 day supply for flonase nasal spray and levocetinzine 5mg  to [facility] in [place]

## 2013-05-10 NOTE — Telephone Encounter (Signed)
SENT MED IN 

## 2013-05-12 ENCOUNTER — Ambulatory Visit (INDEPENDENT_AMBULATORY_CARE_PROVIDER_SITE_OTHER): Payer: BC Managed Care – PPO | Admitting: General Surgery

## 2013-05-18 ENCOUNTER — Ambulatory Visit (INDEPENDENT_AMBULATORY_CARE_PROVIDER_SITE_OTHER): Payer: BC Managed Care – PPO | Admitting: Surgery

## 2013-05-18 ENCOUNTER — Encounter (INDEPENDENT_AMBULATORY_CARE_PROVIDER_SITE_OTHER): Payer: Self-pay | Admitting: Surgery

## 2013-05-18 VITALS — BP 110/62 | HR 70 | Resp 14 | Ht 65.0 in | Wt 168.2 lb

## 2013-05-18 DIAGNOSIS — K828 Other specified diseases of gallbladder: Secondary | ICD-10-CM

## 2013-05-18 NOTE — Progress Notes (Signed)
Status post laparoscopic cholecystectomy with intraoperative cholangiogram for biliary dyskinesia. Pathology confirmed chronic cholecystitis. The patient is completely asymptomatic. She denies any problems with diarrhea. Abdomen soft nontender. Incisions are well-healed no sign of infection. The patient reports regular diet with no problems.  She may resume full activity. Followup as needed.   Wilmon Arms. Corliss Skains, MD, [facility] Surgery  General/ Trauma Surgery  05/18/2013 12:18 PM

## 2013-06-01 ENCOUNTER — Telehealth: Payer: Self-pay | Admitting: Nurse Practitioner

## 2013-06-01 NOTE — Telephone Encounter (Signed)
Patient wants to know if we do Marker testing for Breast cancer?

## 2013-06-02 NOTE — Telephone Encounter (Signed)
Patient called and informed per [place], of [facility] testing is done in our office.  Patient states her 55 year old mother has been diagnosed with breast cancer and is requesting testing.  Consult appt. Scheduled with Dr. Tresa Res on August 12th.

## 2013-06-03 NOTE — Telephone Encounter (Signed)
Spoke with patient about [facility] Testing and Explained to her that a single family member with breast cancer Post Menopausally, is not a strong enough family history to warrant genetic testing. Unless other close family members Have had breast cancer or ovarian cancer, no testing is recommended. Pt was ok with this. Appointment for 06/08/13 Was cancelled in Epic. cm

## 2013-06-03 NOTE — Telephone Encounter (Signed)
A single family member with breast cancer post menopausally is not a strong enough family history to warrant genetic testing.  Let pt know that unless other close family members have had breast cancer or ovarian cancer, no testing is recommended.

## 2013-06-08 ENCOUNTER — Institutional Professional Consult (permissible substitution): Payer: Self-pay | Admitting: Obstetrics and Gynecology

## 2013-06-10 IMAGING — US US ABDOMEN LIMITED
1 series · 14 of 25 positions shown · non-contrast
Comparison: None.

CLINICAL DATA: Right upper quadrant pain, nausea

LIMITED ABDOMINAL ULTRASOUND - RIGHT UPPER QUADRANT

[Series 1: us abdomen limited · 0.26mm/px · 14 of 50 slices shown]
[im 1/50]
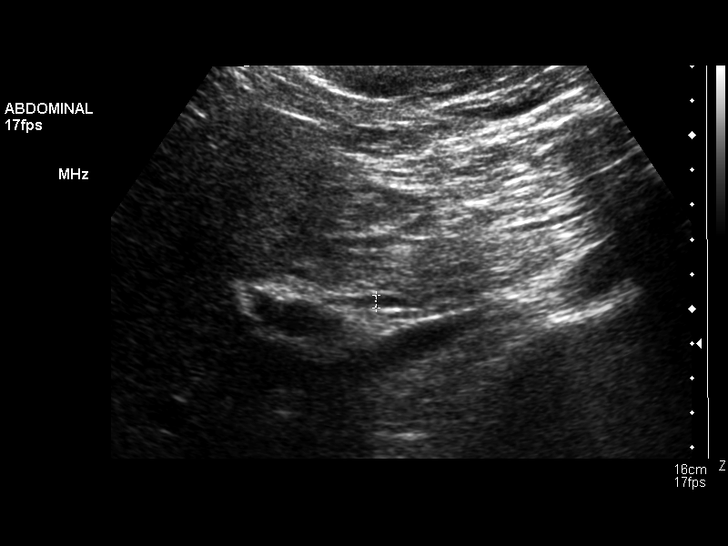
[im 5/50]
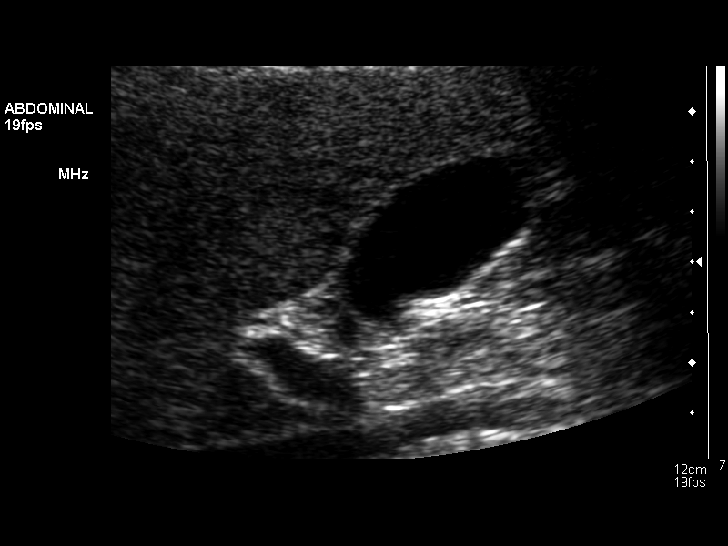
[im 9/50]
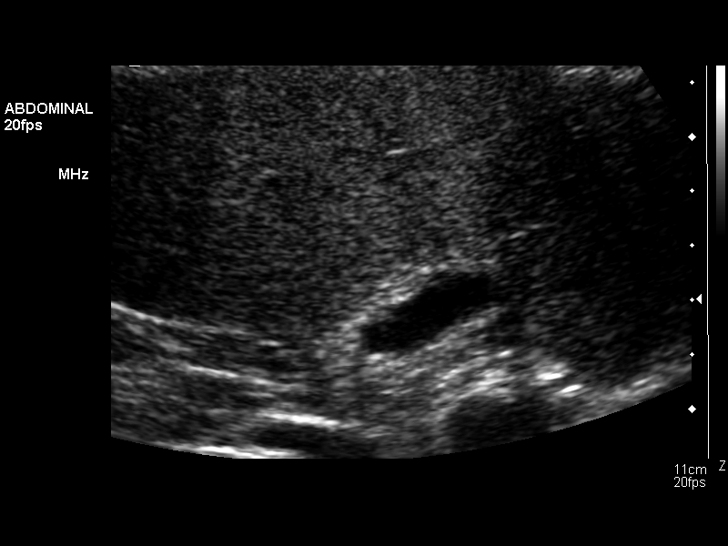
[im 13/50]
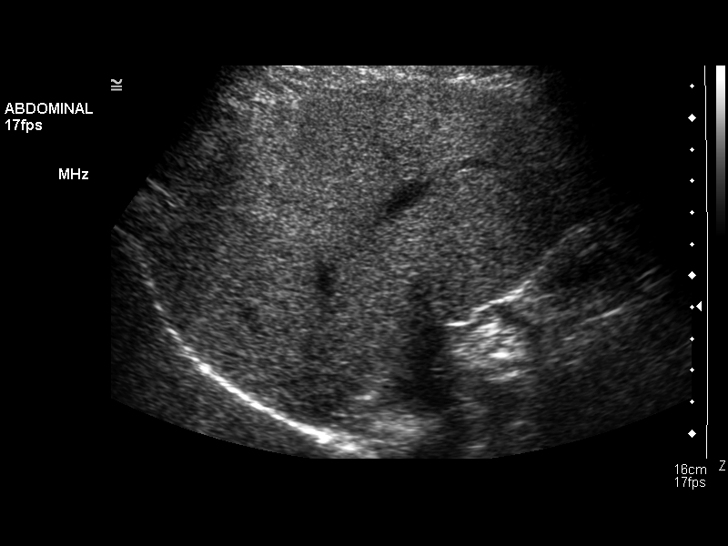
[im 17/50]
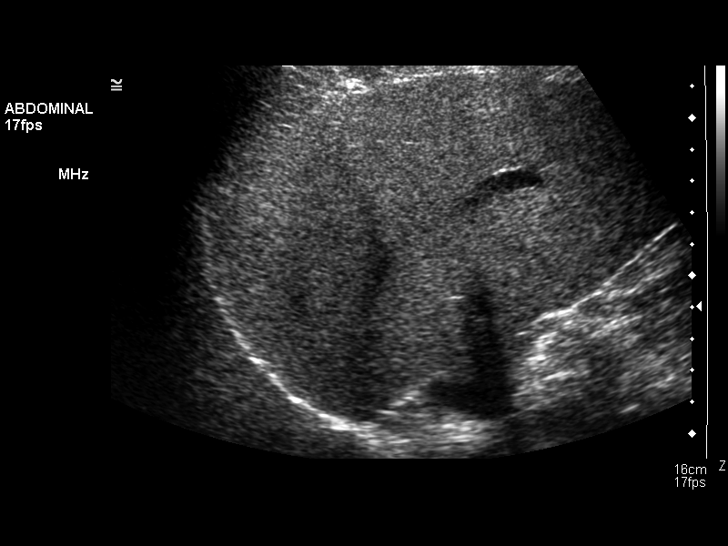
[im 19/50]
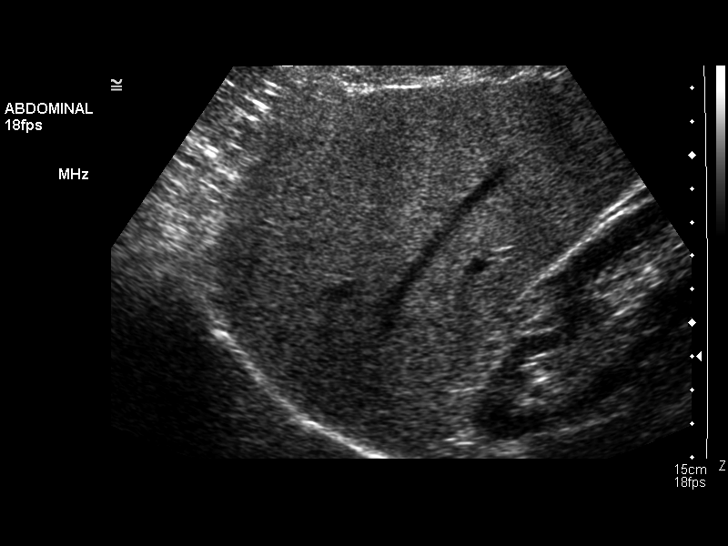
[im 23/50]
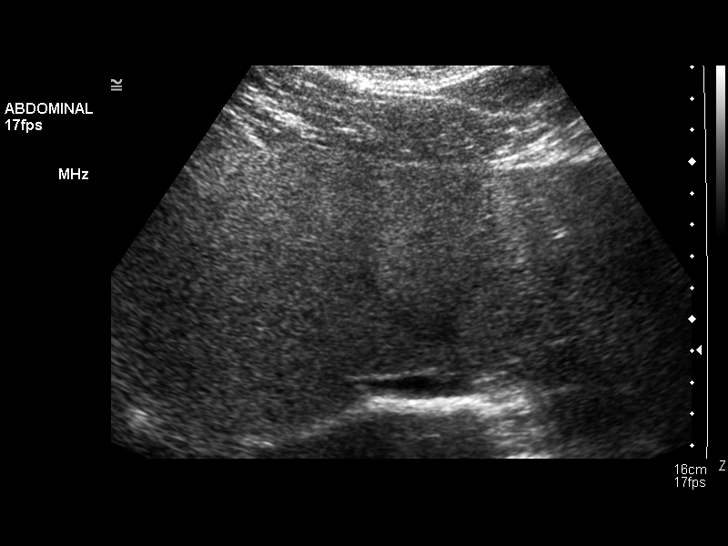
[im 27/50]
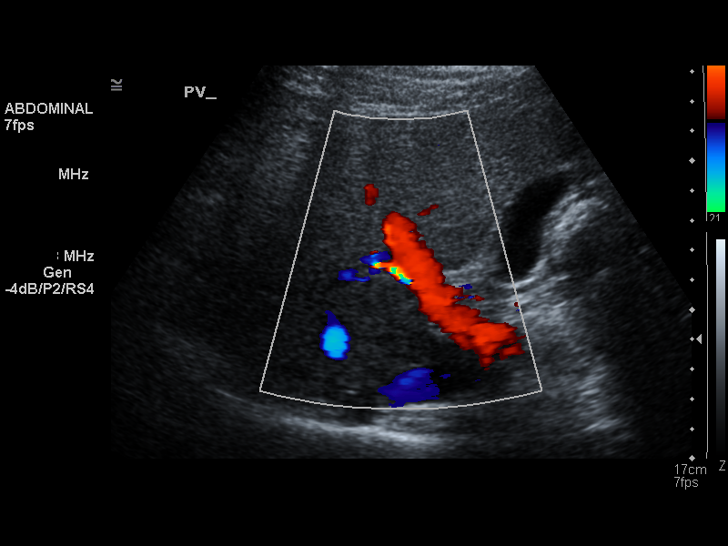
[im 31/50]
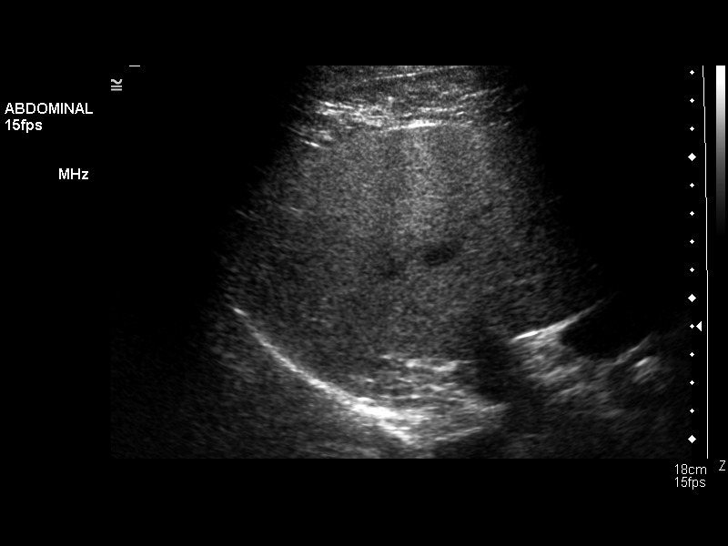
[im 33/50]
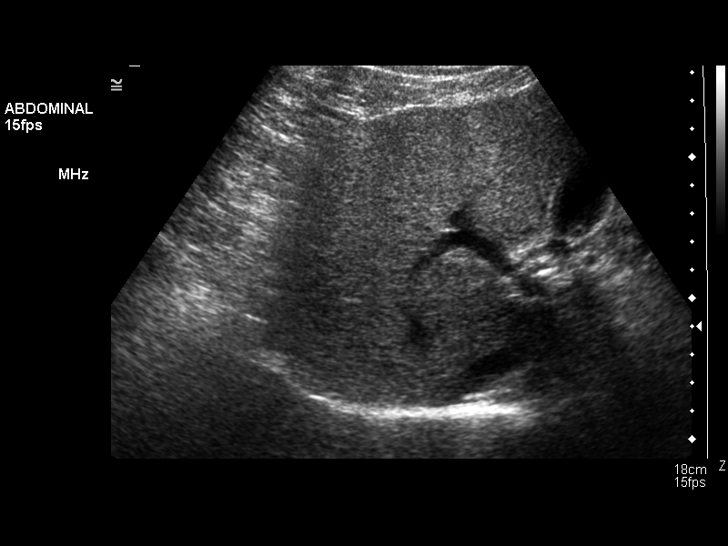
[im 37/50]
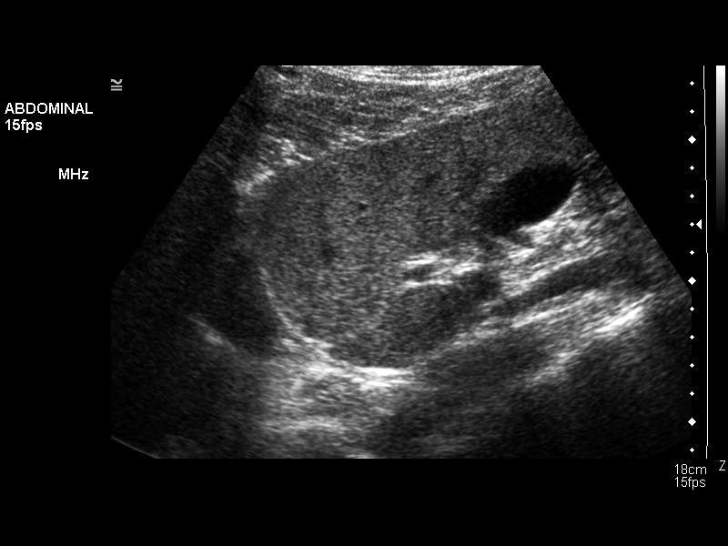
[im 41/50]
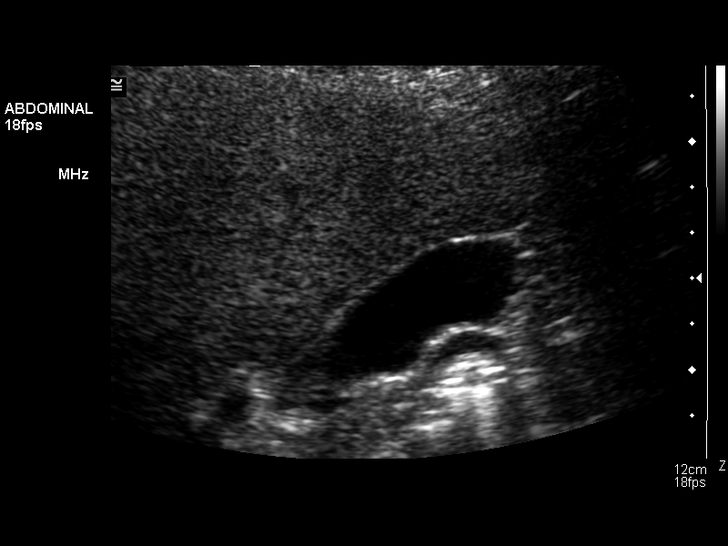
[im 45/50]
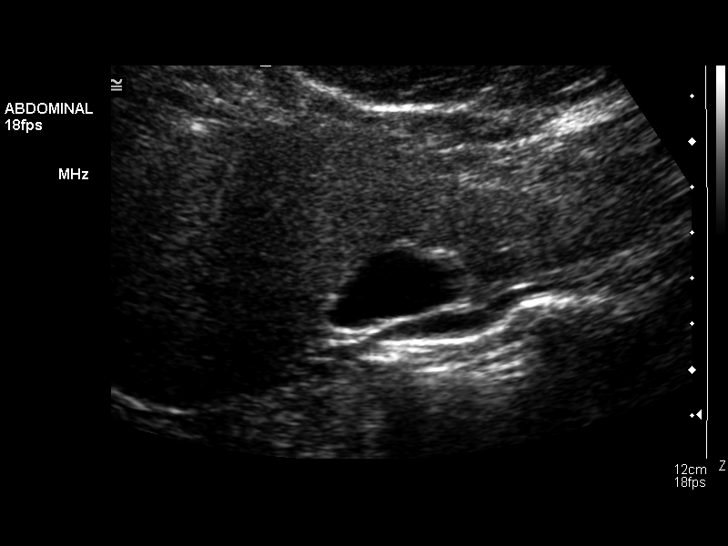
[im 50/50]
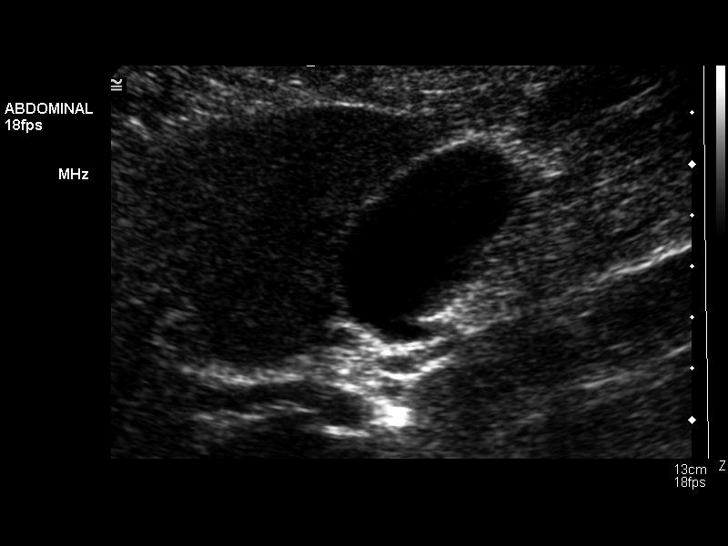

[14 of 25 positions shown; findings below may reference images not displayed]

FINDINGS: Gallbladder:  The gallbladder is visualized and no gallstones are
noted.  There is no pain over the gallbladder with compression.

Common bile duct:  The common bile duct is normal measuring 3.7 ml
in diameter distally.

Liver:  The liver is echogenic and inhomogeneous consistent with
diffuse fatty infiltration.  No focal abnormality is seen.
IMPRESSION: 1.  Diffuse fatty infiltration liver.
2.  No gallstones.

## 2013-07-13 ENCOUNTER — Telehealth: Payer: Self-pay | Admitting: Nurse Practitioner

## 2013-07-13 NOTE — Telephone Encounter (Signed)
Patient feels a lump in her left breast. Please advise?

## 2013-07-13 NOTE — Telephone Encounter (Addendum)
Spoke with patient. Had L breast lump that she noticed last week. Non-tender. Last Mammogram 09/01/2012 at [place], [place]. Booked appointment tomorrow with Dr. Farrel Gobble.

## 2013-07-14 ENCOUNTER — Ambulatory Visit (INDEPENDENT_AMBULATORY_CARE_PROVIDER_SITE_OTHER): Payer: BC Managed Care – PPO | Admitting: Gynecology

## 2013-07-14 ENCOUNTER — Encounter: Payer: Self-pay | Admitting: Gynecology

## 2013-07-14 VITALS — BP 104/66 | HR 64 | Resp 14 | Ht 65.0 in | Wt 171.0 lb

## 2013-07-14 DIAGNOSIS — Z803 Family history of malignant neoplasm of breast: Secondary | ICD-10-CM

## 2013-07-14 DIAGNOSIS — N6320 Unspecified lump in the left breast, unspecified quadrant: Secondary | ICD-10-CM

## 2013-07-14 DIAGNOSIS — N63 Unspecified lump in unspecified breast: Secondary | ICD-10-CM

## 2013-07-14 NOTE — Progress Notes (Signed)
Subjective:     Patient ID: Debra Banks, female   DOB: 10-04-1956, 57 y.o.   MRN: [id number]  HPI Comments: Pt noticed a small mobile lump in left lateral breast a few weeks ago.  Pt denies any change in size.  Pt did not do regular breast exams but started after mother's diagnosis of breast cancer. Pt stopped bio-identical hormones in May 2014    Review of Systems  All other systems reviewed and are negative.       Objective:   Physical Exam  Constitutional: She appears well-developed and well-nourished.  Pulmonary/Chest: Right breast exhibits no inverted nipple, no mass, no nipple discharge, no skin change and no tenderness. Left breast exhibits mass and tenderness. Left breast exhibits no inverted nipple, no nipple discharge and no skin change. Breasts are symmetrical.    Lymphadenopathy:    She has no cervical adenopathy.       Right cervical: No superficial cervical and no deep cervical adenopathy present.      Left cervical: No superficial cervical and no deep cervical adenopathy present.       Right axillary: No pectoral and no lateral adenopathy present.       Left axillary: No pectoral and no lateral adenopathy present.      Right: No supraclavicular adenopathy present.       Left: No supraclavicular adenopathy present.  Skin: Skin is warm and dry.       Assessment:     Breast mass Family history of breast cancer in mother     Plan:     Diagnostic mammogram on left at [facility]- Discussed the role of genetic testing for disease and specifically breast cancer.  Pt's mother is only member of family with breast cancer, there is no history of ovarian or colon, pt's father's prostate cancer is noncontributory.  We discussed the difference between spontaneous and genetic cancers.  We recommend that she continue to do regular breast self exams and get routine mammograms.  She is at an increased risk based on having no pregnancies and her bio-identical hormones. Pt  understands, questions addressed

## 2013-07-19 NOTE — Progress Notes (Addendum)
Late Entry for 07/14/13 at 1620:  Diagnostic Mammogram appointment made for patient at [facility] at office visit: 9/22 at 10 am.

## 2013-08-05 ENCOUNTER — Telehealth: Payer: Self-pay | Admitting: Family Medicine

## 2013-08-05 MED ORDER — SCOPOLAMINE 1 MG/3DAYS TD PT72: 1.0000 | MEDICATED_PATCH | TRANSDERMAL | Status: DC

## 2013-08-05 NOTE — Telephone Encounter (Signed)
Go ahead and call this in 

## 2013-08-05 NOTE — Telephone Encounter (Signed)
DR.LALONDE DO YOU WANT ME TO SEND IN [facility]

## 2013-08-05 NOTE — Telephone Encounter (Signed)
Pt called and is going out of town to [place] this Sat. And would like to know if she can get something to prevent her from getting sea sick, since she will be 40 miles out on a boat.

## 2013-08-05 NOTE — Telephone Encounter (Signed)
CALLED TRANSCOPE PATCH IN

## 2013-09-10 ENCOUNTER — Other Ambulatory Visit: Payer: Self-pay | Admitting: Medical

## 2013-10-12 ENCOUNTER — Other Ambulatory Visit: Payer: Self-pay | Admitting: Dermatology

## 2013-11-14 ENCOUNTER — Other Ambulatory Visit: Payer: Self-pay | Admitting: Family Medicine

## 2013-12-02 ENCOUNTER — Encounter: Payer: Self-pay | Admitting: Family Medicine

## 2013-12-02 ENCOUNTER — Ambulatory Visit (INDEPENDENT_AMBULATORY_CARE_PROVIDER_SITE_OTHER): Payer: BC Managed Care – PPO | Admitting: Family Medicine

## 2013-12-02 VITALS — BP 120/72 | HR 68 | Wt 275.0 lb

## 2013-12-02 DIAGNOSIS — S6390XA Sprain of unspecified part of unspecified wrist and hand, initial encounter: Secondary | ICD-10-CM

## 2013-12-02 DIAGNOSIS — IMO0001 Reserved for inherently not codable concepts without codable children: Secondary | ICD-10-CM

## 2013-12-02 DIAGNOSIS — M7700 Medial epicondylitis, unspecified elbow: Secondary | ICD-10-CM

## 2013-12-02 DIAGNOSIS — M7701 Medial epicondylitis, right elbow: Secondary | ICD-10-CM

## 2013-12-02 NOTE — Progress Notes (Signed)
   Subjective:    Patient ID: Norval Mortonracey L Turpen, female    DOB: 1956-10-08, 58 y.o.   MRN: [id number]  HPI She sustained an injury to the right third finger yesterday while restraining a young horse. She complains of pain on the palmar surface of her third finger with swelling and discoloration. She also complains of right medial elbow pain.   Review of Systems     Objective:   Physical Exam Exam of the right third finger does show swelling and discoloration over the palmar surface of the hand. Pain on flexion of the finger however the flexor tendon in each joint seems to be intact. No tenderness in the palm of the hand. X-ray is negative. Exam of the right medial elbow does show tenderness palpation over the medial epicondyles with increased pain on flexion of the flexor tendons.       Assessment & Plan:  Sprain of third finger of right hand  Medial epicondylitis of right elbow  recommend buddy taping the third finger. Discussed doing as many things as possible with the palm open in order to protect the elbow. Check here in 2 weeks.

## 2013-12-02 NOTE — Patient Instructions (Addendum)
Ice for 20 minutes 3 or 4 times per day. You can take 4 ibuprofen 3 times per day. Buddy taped your finger Try to do things with your palms open and potentially down to that we'll

## 2013-12-14 ENCOUNTER — Ambulatory Visit: Payer: BC Managed Care – PPO | Admitting: Family Medicine

## 2014-01-06 ENCOUNTER — Other Ambulatory Visit: Payer: Self-pay | Admitting: Family Medicine

## 2014-01-25 ENCOUNTER — Ambulatory Visit: Payer: BC Managed Care – PPO | Admitting: Nurse Practitioner

## 2014-01-25 ENCOUNTER — Telehealth: Payer: Self-pay | Admitting: Nurse Practitioner

## 2014-01-25 NOTE — Telephone Encounter (Signed)
Pt cancel appointment for today for AEX. She rescheduled to 02/08/2014 @ 3pm. Pt states she is dealing with her horse and her foal.

## 2014-01-27 ENCOUNTER — Ambulatory Visit: Payer: BC Managed Care – PPO | Admitting: Nurse Practitioner

## 2014-02-08 ENCOUNTER — Encounter: Payer: Self-pay | Admitting: Nurse Practitioner

## 2014-02-08 ENCOUNTER — Ambulatory Visit (INDEPENDENT_AMBULATORY_CARE_PROVIDER_SITE_OTHER): Payer: BC Managed Care – PPO | Admitting: Nurse Practitioner

## 2014-02-08 VITALS — BP 100/60 | HR 68 | Ht 64.5 in | Wt 170.0 lb

## 2014-02-08 DIAGNOSIS — E7800 Pure hypercholesterolemia, unspecified: Secondary | ICD-10-CM

## 2014-02-08 DIAGNOSIS — I10 Essential (primary) hypertension: Secondary | ICD-10-CM

## 2014-02-08 DIAGNOSIS — Z01419 Encounter for gynecological examination (general) (routine) without abnormal findings: Secondary | ICD-10-CM

## 2014-02-08 MED ORDER — MEDROXYPROGESTERONE ACETATE 10 MG PO TABS: 10.0000 mg | ORAL_TABLET | Freq: Every day | ORAL | Status: DC

## 2014-02-08 MED ORDER — SCOPOLAMINE 1 MG/3DAYS TD PT72: 1.0000 | MEDICATED_PATCH | TRANSDERMAL | Status: DC

## 2014-02-08 NOTE — Progress Notes (Signed)
Patient ID: Debra Banks, female   DOB: [birth year], 58 y.o.   MRN: [id number] 58 y.o. G0P0 Significant Other Caucasian Fe here for annual exam.  Stopped bio identical HRT 12/2012.  Had menses in June and November and since then amenorrhea.  Sometimes feels like going to have a cycle. Some vaso symptoms that are tolerable.  States she felt better on HRT.  Patient's last menstrual period was 08/28/2013.          Sexually active: yes  The current method of family planning is post menopausal status and same sex partner.    Exercising: no  The patient does not participate in regular exercise at present. Smoker:  no  Health Maintenance: Pap:  01/21/12, WNL, neg HR HPV MMG:  07/19/13, ultrasound Bi-Rads 2: benign Colonoscopy:  07/2011, repeat in 10 years BMD:   never TDaP:  UTD Labs: Debra Banks   reports that she has quit smoking. She has never used smokeless tobacco. She reports that she drinks about 2.4 - 3.6 ounces of alcohol per week. She reports that she does not use illicit drugs.  Past Medical History  Diagnosis Date  . Hypertension   . Allergy     RHINITIS  . Migraines     with aura  none in 3-4 yrs  . Complication of anesthesia   . PONV (postoperative nausea and vomiting)   . GERD (gastroesophageal reflux disease)     Hx: of GERD that went away after GB removed    Past Surgical History  Procedure Laterality Date  . Laparoscopy      age 58 to r/o ovarian cyst  . Labial revision  12/2002    secondary to hypertrophy  . Hemorrhoid surgery  2005-2006  . Dilation and curettage of uterus      Hx: of at 23 yrs.  . Cholecystectomy N/A 05/04/2013    Procedure: LAPAROSCOPIC CHOLECYSTECTOMY;  Surgeon: Wilmon ArmsMatthew K. Corliss Skainssuei, MD;  Location: [facility] OR;  Service: General;  Laterality: N/A;    Current Outpatient Prescriptions  Medication Sig Dispense Refill  . aspirin EC 81 MG tablet Take 81 mg by mouth every evening.      Marland Kitchen. atorvastatin (LIPITOR) 20 MG tablet Take 20 mg by mouth every evening.       . cyanocobalamin 1000 MCG tablet Take 100 mcg by mouth daily.      . fish oil-omega-3 fatty acids 1000 MG capsule Take 1 g by mouth daily.      . fluticasone (FLONASE) 50 MCG/ACT nasal spray Place 2 sprays into the nose daily.  48 g  4  . glucosamine-chondroitin 500-400 MG tablet Take 1 tablet by mouth daily.      Marland Kitchen. levocetirizine (XYZAL) 5 MG tablet Take 1 tablet (5 mg total) by mouth every morning.  90 tablet  4  . lisinopril-hydrochlorothiazide (PRINZIDE,ZESTORETIC) 10-12.5 MG per tablet Take 1 tablet by mouth daily.      . Magnesium 500 MG TABS Take 1 tablet by mouth daily.      . Soy Isoflavone 40 MG TABS Take 1 tablet by mouth 2 (two) times daily.      . vitamin E (VITAMIN E) 400 UNIT capsule Take 400 Units by mouth daily.      Marland Kitchen. scopolamine (TRANSDERM-SCOP) 1 MG/3DAYS Place 1 patch (1.5 mg total) onto the skin every 3 (three) days.  4 patch  1   No current facility-administered medications for this visit.    Family History  Problem Relation Age of Onset  .  Hypertension Father   . Cancer Father     brain tumor  . Hypertension Sister   . Cancer Sister     melanoma  . Hyperlipidemia Mother   . Hypertension Mother   . Breast cancer Mother [id number]  . Cancer Mother     skin  . Heart disease Maternal Grandmother   . Heart disease Maternal Grandfather   . Heart disease Paternal Grandfather   . Ovarian cancer Other     ROS:  Pertinent items are noted in HPI.  Otherwise, a comprehensive ROS was negative.  Exam:   BP 100/60  Pulse 68  Ht 5' 4.5" (1.638 m)  Wt 170 lb (77.111 kg)  BMI 28.74 kg/m2  LMP 08/28/2013 Height: 5' 4.5" (163.8 cm)  Ht Readings from Last 3 Encounters:  02/08/14 5' 4.5" (1.638 m)  07/14/13 5\' 5"  (1.651 m)  05/18/13 5\' 5"  (1.651 m)    General appearance: alert, cooperative and appears stated age Head: Normocephalic, without obvious abnormality, atraumatic Neck: no adenopathy, supple, symmetrical, trachea midline and thyroid normal to inspection and  palpation Lungs: clear to auscultation bilaterally Breasts: normal appearance, no masses or tenderness Heart: regular rate and rhythm Abdomen: soft, non-tender; no masses,  no organomegaly Extremities: extremities normal, atraumatic, no cyanosis or edema Skin: Skin color, texture, turgor normal. No rashes or lesions Lymph nodes: Cervical, supraclavicular, and axillary nodes normal. No abnormal inguinal nodes palpated Neurologic: Grossly normal   Pelvic: External genitalia:  no lesions              Urethra:  normal appearing urethra with no masses, tenderness or lesions              Bartholin's and Skene's: normal                 Vagina: normal appearing vagina with normal color and discharge, no lesions              Cervix: anteverted              Pap taken: no Bimanual Exam:  Uterus:  normal size, contour, position, consistency, mobility, non-tender              Adnexa: no mass, fullness, tenderness               Rectovaginal: Confirms               Anus:  normal sphincter tone, no lesions  A:  Well Woman with normal exam  Postmenopausal with bio identical HRT stopped 4 /2014  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  She will call with +/- results  Rationale explained to patient and she is willing to take  Counseled on breast self exam, mammography screening, adequate intake of calcium and vitamin D, diet and exercise return annually or prn  An After Visit Summary was printed and given to the patient.

## 2014-02-08 NOTE — Progress Notes (Signed)
Encounter reviewed by Dr. Mirah Nevins Silva.  

## 2014-02-08 NOTE — Patient Instructions (Addendum)

## 2014-03-09 ENCOUNTER — Other Ambulatory Visit: Payer: Self-pay | Admitting: Family Medicine

## 2014-04-06 ENCOUNTER — Other Ambulatory Visit: Payer: Self-pay | Admitting: Family Medicine

## 2014-04-06 NOTE — Telephone Encounter (Signed)
PATIENT NEEDS A OFFICE VISIT. 

## 2014-05-20 ENCOUNTER — Other Ambulatory Visit: Payer: Self-pay | Admitting: Family Medicine

## 2014-05-20 ENCOUNTER — Telehealth: Payer: Self-pay | Admitting: Family Medicine

## 2014-05-20 MED ORDER — LEVOCETIRIZINE DIHYDROCHLORIDE 5 MG PO TABS: 5.0000 mg | ORAL_TABLET | Freq: Every morning | ORAL | Status: DC

## 2014-05-20 MED ORDER — LISINOPRIL-HYDROCHLOROTHIAZIDE 10-12.5 MG PO TABS: 1.0000 | ORAL_TABLET | Freq: Every day | ORAL | Status: DC

## 2014-05-20 MED ORDER — ATORVASTATIN CALCIUM 20 MG PO TABS: 20.0000 mg | ORAL_TABLET | Freq: Every evening | ORAL | Status: DC

## 2014-05-20 MED ORDER — FLUTICASONE PROPIONATE 50 MCG/ACT NA SUSP: 2.0000 | Freq: Every day | NASAL | Status: DC

## 2014-05-20 NOTE — Telephone Encounter (Signed)
Rx sent to the [facility] and patient will receive additional refills after the scheduled appointment. CLS

## 2014-05-27 ENCOUNTER — Other Ambulatory Visit: Payer: Self-pay | Admitting: Family Medicine

## 2014-06-17 ENCOUNTER — Ambulatory Visit (INDEPENDENT_AMBULATORY_CARE_PROVIDER_SITE_OTHER): Payer: BC Managed Care – PPO | Admitting: Family Medicine

## 2014-06-17 ENCOUNTER — Encounter: Payer: Self-pay | Admitting: Family Medicine

## 2014-06-17 VITALS — BP 118/80 | HR 78 | Wt 170.0 lb

## 2014-06-17 DIAGNOSIS — E785 Hyperlipidemia, unspecified: Secondary | ICD-10-CM

## 2014-06-17 DIAGNOSIS — Z23 Encounter for immunization: Secondary | ICD-10-CM

## 2014-06-17 DIAGNOSIS — I10 Essential (primary) hypertension: Secondary | ICD-10-CM

## 2014-06-17 DIAGNOSIS — J301 Allergic rhinitis due to pollen: Secondary | ICD-10-CM

## 2014-06-17 LAB — LIPID PANEL
CHOLESTEROL: 133 mg/dL (ref 0–200)
HDL: 47 mg/dL (ref >39)
LDL Cholesterol: 68 mg/dL (ref 0–99)
Total CHOL/HDL Ratio: 2.8 Ratio
Triglycerides: 90 mg/dL (ref <150)
VLDL: 18 mg/dL (ref 0–40)

## 2014-06-17 LAB — COMPREHENSIVE METABOLIC PANEL
ALBUMIN: 4.5 g/dL (ref 3.5–5.2)
ALK PHOS: 51 U/L (ref 39–117)
ALT: 25 U/L (ref 0–35)
AST: 17 U/L (ref 0–37)
BUN: 14 mg/dL (ref 6–23)
CALCIUM: 9.3 mg/dL (ref 8.4–10.5)
CHLORIDE: 103 meq/L (ref 96–112)
CO2: 27 mEq/L (ref 19–32)
Creat: 0.79 mg/dL (ref 0.50–1.10)
Glucose, Bld: 96 mg/dL (ref 70–99)
Potassium: 4.2 mEq/L (ref 3.5–5.3)
Sodium: 137 mEq/L (ref 135–145)
Total Bilirubin: 0.5 mg/dL (ref 0.2–1.2)
Total Protein: 7 g/dL (ref 6.0–8.3)

## 2014-06-17 LAB — CBC WITH DIFFERENTIAL/PLATELET
BASOS PCT: 0 % (ref 0–1)
Basophils Absolute: 0 10*3/uL (ref 0.0–0.1)
Eosinophils Absolute: 0.2 10*3/uL (ref 0.0–0.7)
Eosinophils Relative: 2 % (ref 0–5)
HEMATOCRIT: 39.9 % (ref 36.0–46.0)
Hemoglobin: 13.9 g/dL (ref 12.0–15.0)
Lymphocytes Relative: 23 % (ref 12–46)
Lymphs Abs: 1.8 10*3/uL (ref 0.7–4.0)
MCH: 29.1 pg (ref 26.0–34.0)
MCHC: 34.8 g/dL (ref 30.0–36.0)
MCV: 83.6 fL (ref 78.0–100.0)
MONO ABS: 0.7 10*3/uL (ref 0.1–1.0)
MONOS PCT: 9 % (ref 3–12)
NEUTROS ABS: 5.2 10*3/uL (ref 1.7–7.7)
Neutrophils Relative %: 66 % (ref 43–77)
Platelets: 357 10*3/uL (ref 150–400)
RBC: 4.77 MIL/uL (ref 3.87–5.11)
RDW: 14 % (ref 11.5–15.5)
WBC: 7.9 10*3/uL (ref 4.0–10.5)

## 2014-06-17 MED ORDER — ATORVASTATIN CALCIUM 20 MG PO TABS: 20.0000 mg | ORAL_TABLET | Freq: Every evening | ORAL | Status: DC

## 2014-06-17 MED ORDER — LISINOPRIL-HYDROCHLOROTHIAZIDE 10-12.5 MG PO TABS: ORAL_TABLET | ORAL | Status: DC

## 2014-06-18 ENCOUNTER — Encounter: Payer: Self-pay | Admitting: Family Medicine

## 2014-06-18 NOTE — Progress Notes (Signed)
   Subjective:    Patient ID: Debra Banks, female    DOB: 05-31-1956, 58 y.o.   MRN: [id number]  HPI She is here for medication recheck. She continues on her blood pressure medication and is having no difficulty with that or with Lipitor. She does have questions about continued use of her Lipitor. Her allergies are under good control. Her social history including smoking and drinking was reviewed. Medications were reviewed.   Review of Systems     Objective:   Physical Exam Alert and in no distress otherwise not examined       Assessment & Plan:  Essential hypertension, benign - Plan: CBC with Differential, Comprehensive metabolic panel, lisinopril-hydrochlorothiazide (PRINZIDE,ZESTORETIC) 10-12.5 MG per tablet  Need for prophylactic vaccination and inoculation against influenza - Plan: Flu Vaccine QUAD 36+ mos IM  hyperlipidemia - Plan: Lipid panel, atorvastatin (LIPITOR) 20 MG tablet  Allergic rhinitis due to pollen  She will continue on her statin as well as blood pressure medication and allergy medications. I discussed the multiple other medicines she is using and apparently she is doing this through a wellness program. Explained that there is no good data to support the use of some these medications especially high doses of the fat soluble vitamins. Over 20 minutes spent counseling her concerning this.

## 2014-09-13 ENCOUNTER — Other Ambulatory Visit: Payer: Self-pay | Admitting: Medical

## 2015-02-14 ENCOUNTER — Encounter: Payer: Self-pay | Admitting: Nurse Practitioner

## 2015-02-14 ENCOUNTER — Ambulatory Visit (INDEPENDENT_AMBULATORY_CARE_PROVIDER_SITE_OTHER): Payer: BLUE CROSS/BLUE SHIELD | Admitting: Nurse Practitioner

## 2015-02-14 VITALS — BP 110/76 | HR 68 | Ht 64.5 in | Wt 175.0 lb

## 2015-02-14 DIAGNOSIS — Z0000 Encounter for general adult medical examination without abnormal findings: Secondary | ICD-10-CM

## 2015-02-14 DIAGNOSIS — Z01419 Encounter for gynecological examination (general) (routine) without abnormal findings: Secondary | ICD-10-CM

## 2015-02-14 NOTE — Patient Instructions (Signed)

## 2015-02-14 NOTE — Progress Notes (Signed)
Patient ID: Debra Banks, female   DOB: 08-Aug-1956, 59 y.o.   MRN: [id number] 59 y.o. G0P0 Significant Other Caucasian Fe here for annual exam.  She was on bio- identical HRT with last inserted pellet with a combination of Estrogen and Testosterone 09/13/14.  She continued on Provera daily.  In December they were adjusting her estrogen and she had menses for 16 days over christmas and New years.  Then another adjustment and had menses 12/27/2014 for 5-6 days.   She then stopped HRT.  Since then an increase in vaso symptoms despite increasing her soy.      Patient's last menstrual period was 12/27/2014.    Then stopped HRT      Sexually active: Yes.    The current method of family planning is post menopausal status and same sex partner.    Exercising: No.  The patient does not participate in regular exercise at present.  Pt has [place] horse [place]. Smoker:  no  Health Maintenance: Pap:  01/21/12, negative with neg HR HPV MMG:  09/20/14, Bi-Rads 2:  Benign Colonoscopy:  07/2011, repeat in 10 years BMD:   never TDaP:  UTD Labs: PCP, Dr. [name]   reports that she has quit smoking. She has never used smokeless tobacco. She reports that she drinks about 2.4 - 3.6 oz of alcohol per week. She reports that she does not use illicit drugs.    Past Medical History  Diagnosis Date  . Hypertension   . Allergy     RHINITIS  . Migraines     with aura  none in 3-4 yrs  . Complication of anesthesia   . PONV (postoperative nausea and vomiting)   . GERD (gastroesophageal reflux disease)     Hx: of GERD that went away after GB removed    Past Surgical History  Procedure Laterality Date  . Laparoscopy      age 59 to r/o ovarian cyst  . Labial revision  12/2002    secondary to hypertrophy  . Hemorrhoid surgery  2005-2006  . Dilation and curettage of uterus      Hx: of at 23 yrs.  . Cholecystectomy N/A 05/04/2013    Procedure: LAPAROSCOPIC CHOLECYSTECTOMY;  Surgeon: Wilmon ArmsMatthew K. Corliss Skainssuei, MD;  Location: [facility]  OR;  Service: General;  Laterality: N/A;    Current Outpatient Prescriptions  Medication Sig Dispense Refill  . aspirin EC 81 MG tablet Take 81 mg by mouth every evening.    Marland Kitchen. atorvastatin (LIPITOR) 20 MG tablet Take 1 tablet (20 mg total) by mouth every evening. 90 tablet 3  . Cholecalciferol (VITAMIN D3) 3000 UNITS TABS Take by mouth.    . cyanocobalamin 1000 MCG tablet Take 100 mcg by mouth daily. VITAMIN B12    . fluticasone (FLONASE) 50 MCG/ACT nasal spray PLACE 2 SPRAYS INTO BOTH NOSTRILS DAILY. 16 g 0  . levocetirizine (XYZAL) 5 MG tablet Take 1 tablet (5 mg total) by mouth every morning. 30 tablet 0  . lisinopril-hydrochlorothiazide (PRINZIDE,ZESTORETIC) 10-12.5 MG per tablet TAKE 1 TABLET BY MOUTH EVERY DAY 90 tablet 3  . Magnesium 500 MG TABS Take 1 tablet by mouth daily.    . NON FORMULARY Take 4 capsules by mouth daily. OMEGA XL    . scopolamine (TRANSDERM-SCOP) 1 MG/3DAYS Place 1 patch (1.5 mg total) onto the skin every 3 (three) days. 4 patch 1  . vitamin E (VITAMIN E) 400 UNIT capsule Take 400 Units by mouth daily.     No current  facility-administered medications for this visit.    Family History  Problem Relation Age of Onset  . Hypertension Father   . Cancer Father     brain tumor  . Hypertension Sister   . Cancer Sister     melanoma  . Hyperlipidemia Mother   . Hypertension Mother   . Breast cancer Mother 61  . Cancer Mother     skin  . Heart disease Maternal Grandmother   . Heart disease Maternal Grandfather   . Heart disease Paternal Grandfather   . Ovarian cancer Other     ROS:  Pertinent items are noted in HPI.  Otherwise, a comprehensive ROS was negative.  Exam:   BP 110/76 mmHg  Pulse 68  Ht 5' 4.5" (1.638 m)  Wt 175 lb (79.379 kg)  BMI 29.59 kg/m2  LMP 12/27/2014 Height: 5' 4.5" (163.8 cm) Ht Readings from Last 3 Encounters:  02/14/15 5' 4.5" (1.638 m)  02/08/14 5' 4.5" (1.638 m)  07/14/13  (1.651 m)    General appearance: alert,  cooperative and appears stated age Head: Normocephalic, without obvious abnormality, atraumatic Neck: no adenopathy, supple, symmetrical, trachea midline and thyroid normal to inspection and palpation Lungs: clear to auscultation bilaterally Breasts: normal appearance, no masses or tenderness Heart: regular rate and rhythm Abdomen: soft, non-tender; no masses,  no organomegaly Extremities: extremities normal, atraumatic, no cyanosis or edema Skin: Skin color, texture, turgor normal. No rashes or lesions Lymph nodes: Cervical, supraclavicular, and axillary nodes normal. No abnormal inguinal nodes palpated Neurologic: Grossly normal   Pelvic: External genitalia:  no lesions              Urethra:  normal appearing urethra with no masses, tenderness or lesions              Bartholin's and Skene's: normal                 Vagina: normal appearing vagina with normal color and discharge, no lesions              Cervix: anteverted              Pap taken: Yes.   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

## 2015-02-16 NOTE — Progress Notes (Signed)
Encounter reviewed by Dr. Brook Silva.  

## 2015-02-16 NOTE — Addendum Note (Signed)
Addended by: Ria CommentGRUBB, Khai Torbert R on: 02/16/2015 09:00 AM   Modules accepted: Orders

## 2015-02-20 LAB — IPS PAP TEST WITH HPV

## 2015-05-31 ENCOUNTER — Other Ambulatory Visit: Payer: Self-pay | Admitting: Family Medicine

## 2015-06-07 ENCOUNTER — Encounter: Payer: Self-pay | Admitting: Family Medicine

## 2015-06-07 ENCOUNTER — Ambulatory Visit (INDEPENDENT_AMBULATORY_CARE_PROVIDER_SITE_OTHER): Payer: BLUE CROSS/BLUE SHIELD | Admitting: Family Medicine

## 2015-06-07 VITALS — BP 112/70 | HR 76 | Wt 188.0 lb

## 2015-06-07 DIAGNOSIS — J302 Other seasonal allergic rhinitis: Secondary | ICD-10-CM

## 2015-06-07 DIAGNOSIS — I10 Essential (primary) hypertension: Secondary | ICD-10-CM | POA: Diagnosis not present

## 2015-06-07 DIAGNOSIS — E785 Hyperlipidemia, unspecified: Secondary | ICD-10-CM

## 2015-06-07 DIAGNOSIS — Z79899 Other long term (current) drug therapy: Secondary | ICD-10-CM | POA: Diagnosis not present

## 2015-06-07 DIAGNOSIS — M25561 Pain in right knee: Secondary | ICD-10-CM

## 2015-06-07 LAB — COMPREHENSIVE METABOLIC PANEL
ALK PHOS: 60 U/L (ref 33–130)
ALT: 37 U/L — AB (ref 6–29)
AST: 20 U/L (ref 10–35)
Albumin: 4.7 g/dL (ref 3.6–5.1)
BUN: 18 mg/dL (ref 7–25)
CO2: 27 mmol/L (ref 20–31)
Calcium: 10 mg/dL (ref 8.6–10.4)
Chloride: 99 mmol/L (ref 98–110)
Creat: 0.75 mg/dL (ref 0.50–1.05)
Glucose, Bld: 79 mg/dL (ref 65–99)
Potassium: 3.9 mmol/L (ref 3.5–5.3)
SODIUM: 138 mmol/L (ref 135–146)
TOTAL PROTEIN: 7.7 g/dL (ref 6.1–8.1)
Total Bilirubin: 0.3 mg/dL (ref 0.2–1.2)

## 2015-06-07 LAB — CBC WITH DIFFERENTIAL/PLATELET
Basophils Absolute: 0 10*3/uL (ref 0.0–0.1)
Basophils Relative: 0 % (ref 0–1)
Eosinophils Absolute: 0.1 10*3/uL (ref 0.0–0.7)
Eosinophils Relative: 2 % (ref 0–5)
HEMATOCRIT: 42.9 % (ref 36.0–46.0)
HEMOGLOBIN: 14.5 g/dL (ref 12.0–15.0)
LYMPHS ABS: 2.2 10*3/uL (ref 0.7–4.0)
Lymphocytes Relative: 36 % (ref 12–46)
MCH: 29.1 pg (ref 26.0–34.0)
MCHC: 33.8 g/dL (ref 30.0–36.0)
MCV: 86.1 fL (ref 78.0–100.0)
MONO ABS: 0.7 10*3/uL (ref 0.1–1.0)
MONOS PCT: 12 % (ref 3–12)
MPV: 9.6 fL (ref 8.6–12.4)
Neutro Abs: 3 10*3/uL (ref 1.7–7.7)
Neutrophils Relative %: 50 % (ref 43–77)
Platelets: 369 10*3/uL (ref 150–400)
RBC: 4.98 MIL/uL (ref 3.87–5.11)
RDW: 13.6 % (ref 11.5–15.5)
WBC: 6 10*3/uL (ref 4.0–10.5)

## 2015-06-07 LAB — LIPID PANEL
Cholesterol: 165 mg/dL (ref 125–200)
HDL: 49 mg/dL (ref >=46)
LDL CALC: 82 mg/dL (ref <130)
Total CHOL/HDL Ratio: 3.4 Ratio (ref <=5.0)
Triglycerides: 171 mg/dL — ABNORMAL HIGH (ref <150)
VLDL: 34 mg/dL — ABNORMAL HIGH (ref <30)

## 2015-06-07 MED ORDER — ATORVASTATIN CALCIUM 20 MG PO TABS: 20.0000 mg | ORAL_TABLET | Freq: Every evening | ORAL | Status: DC

## 2015-06-07 MED ORDER — LISINOPRIL-HYDROCHLOROTHIAZIDE 10-12.5 MG PO TABS
1.0000 | ORAL_TABLET | Freq: Every day | ORAL | Status: DC
Start: 2015-06-07 — End: 2016-09-05

## 2015-06-07 NOTE — Progress Notes (Signed)
   Subjective:    Patient ID: Debra Banks, female    DOB: [birth year], 59 y.o.   MRN: [id number]  HPI She is here for evaluation of a two-week history of right lateral knee discomfort. She notes that especially when she flexes her knee. She has had no new activities in her life. No history of injury. No other joints are involved. No popping, locking or grinding. She also has seasonal allergies and is doing well on her present medication regimen. No sneezing, itchy watery eyes. She also has a history of hyperlipidemia and is on a statin for this. She has had no muscle aches or pains. She continues on her blood pressure medication. She has had no difficulty with this.   Review of Systems     Objective:   Physical Exam Alert and in no distress. No effusion is noted. Slight discomfort along the lateral pole of the knee. Compression testing negative. Anterior drawer negative. Medial and lateral collateral ligaments intact       Assessment & Plan:  Right knee pain  Other seasonal allergic rhinitis - Plan: CBC with Differential/Platelet, Comprehensive metabolic panel  Essential hypertension, benign - Plan: CBC with Differential/Platelet, Comprehensive metabolic panel  Hyperlipidemia LDL goal <130 - Plan: Lipid panel  Encounter for long-term (current) use of medications - Plan: CBC with Differential/Platelet, Comprehensive metabolic panel, Lipid panel I will renew her medication. Routine blood screening ordered. Discussed follow-up on the knee and recommend heat, stretching and NSAID of choice. Explained that at this point there is no evidence of any major problems. Symptoms change she will return here.

## 2015-07-10 ENCOUNTER — Ambulatory Visit (INDEPENDENT_AMBULATORY_CARE_PROVIDER_SITE_OTHER): Payer: BLUE CROSS/BLUE SHIELD | Admitting: Family Medicine

## 2015-07-10 ENCOUNTER — Encounter: Payer: Self-pay | Admitting: Family Medicine

## 2015-07-10 VITALS — BP 114/80 | HR 65 | Wt 181.0 lb

## 2015-07-10 DIAGNOSIS — S90425A Blister (nonthermal), left lesser toe(s), initial encounter: Secondary | ICD-10-CM

## 2015-07-10 NOTE — Progress Notes (Signed)
   Subjective:    Patient ID: Debra Banks, female    DOB: [birth year], 59 y.o.   MRN: [id number]  HPI She is here for evaluation of a blister between her fourth and fifth toes she noted approximately 5 days ago. She has had no new shoes, not changed her physical activities or had blisters in other areas.   Review of Systems     Objective:   Physical Exam 1 cm blister noted between the fourth and fifth toes. No erythema. No bony tenderness.       Assessment & Plan:  Blister of toe of left foot, initial encounter A needle was used to puncture the blister and clear fluid was expressed. Recommend she keep the area clean and pad this area to see if it is indeed. She will call if continued difficulty.

## 2015-08-19 ENCOUNTER — Other Ambulatory Visit: Payer: Self-pay | Admitting: Family Medicine

## 2015-09-26 ENCOUNTER — Other Ambulatory Visit: Payer: Self-pay | Admitting: Family Medicine

## 2016-02-16 ENCOUNTER — Ambulatory Visit (INDEPENDENT_AMBULATORY_CARE_PROVIDER_SITE_OTHER): Payer: BLUE CROSS/BLUE SHIELD | Admitting: Family Medicine

## 2016-02-16 ENCOUNTER — Encounter: Payer: Self-pay | Admitting: Family Medicine

## 2016-02-16 ENCOUNTER — Ambulatory Visit
Admission: RE | Admit: 2016-02-16 | Discharge: 2016-02-16 | Disposition: A | Discharge status: Home / Self Care | Payer: BLUE CROSS/BLUE SHIELD | Source: Ambulatory Visit | Attending: Family Medicine | Admitting: Family Medicine

## 2016-02-16 VITALS — BP 120/70 | HR 78 | Ht 64.5 in | Wt 177.0 lb

## 2016-02-16 DIAGNOSIS — T17808A Unspecified foreign body in other parts of respiratory tract causing other injury, initial encounter: Secondary | ICD-10-CM

## 2016-02-16 NOTE — Progress Notes (Signed)
   Subjective:    Patient ID: Debra Banks, female    DOB: 06/24/1956, 60 y.o.   MRN: [id number]  HPI Approximately 10 days ago she coughed and then inhaled and immediately felt some mid chest pain. The pain got worse and approximately 3 days later she developed a cough but no fever, chills, shortness of breath. He also describes another issue of having a spasm in the left neck area that can last for several minutes and then go away.   Review of Systems     Objective:   Physical Exam Alert and in no distress. Throat is clear. Neck is supple with possible lateral cervical lymph node enlargement. Cardiac exam shows regular rhythm without murmurs or gallops. Lungs are clear to auscultation. No chest wall palpable tenderness noted.       Assessment & Plan:  Foreign body of lung, initial encounter - Plan: DG Chest 2 View the x-ray is negative. Recommend watchful waiting concerning her symptoms as at this point I do not think it is an infection and hopefully will clear up on its own. She is comfortable with this.

## 2016-02-20 ENCOUNTER — Ambulatory Visit (INDEPENDENT_AMBULATORY_CARE_PROVIDER_SITE_OTHER): Payer: BLUE CROSS/BLUE SHIELD | Admitting: Nurse Practitioner

## 2016-02-20 ENCOUNTER — Encounter: Payer: Self-pay | Admitting: Nurse Practitioner

## 2016-02-20 VITALS — BP 102/60 | HR 76 | Resp 18 | Ht 64.5 in | Wt 178.0 lb

## 2016-02-20 DIAGNOSIS — Z0000 Encounter for general adult medical examination without abnormal findings: Secondary | ICD-10-CM | POA: Diagnosis not present

## 2016-02-20 DIAGNOSIS — Z01419 Encounter for gynecological examination (general) (routine) without abnormal findings: Secondary | ICD-10-CM

## 2016-02-20 LAB — POCT URINALYSIS DIPSTICK
BILIRUBIN UA: NEGATIVE
Blood, UA: NEGATIVE
GLUCOSE UA: NEGATIVE
KETONES UA: NEGATIVE
LEUKOCYTES UA: NEGATIVE
Nitrite, UA: NEGATIVE
PROTEIN UA: NEGATIVE
UROBILINOGEN UA: NEGATIVE
pH, UA: 6

## 2016-02-20 NOTE — Patient Instructions (Addendum)

## 2016-02-20 NOTE — Progress Notes (Signed)
60 y.o. G0P0 Significant Other  Caucasian Fe here for annual exam.  Started back on HRT 05/2015 secondary to mood changes and vaso symptoms.   She has the Pellets with both Testosterone and Estrogen.  Progesterone 100 mg at HS. Then had spoting after restarting and in September,  Progesterone was increased to 200 mg and no bleeding since.  Patient's last menstrual period was 06/29/2015.     Only spotting secondary to HRT adjustment.     Sexually active: Yes.    The current method of family planning is none, same sex partner. Exercising: No.  The patient does not participate in regular exercise at present. Smoker:  no  Health Maintenance: Pap:  02/14/15 Neg. HR HPV:neg MMG:  10/12/15 BIRADS1:neg Colonoscopy:  10/26/11 Normal (will give IFOB next year) TDaP:  UTD ? Hep C and HIV: Today Labs: PCP UA: Negative   reports that she has quit smoking. She has never used smokeless tobacco. She reports that she drinks about 2.4 - 3.6 oz of alcohol per week. She reports that she does not use illicit drugs.  Past Medical History  Diagnosis Date  . Hypertension   . Allergy     RHINITIS  . Migraines     with aura  none in 3-4 yrs  . Complication of anesthesia   . PONV (postoperative nausea and vomiting)   . GERD (gastroesophageal reflux disease)     Hx: of GERD that went away after GB removed    Past Surgical History  Procedure Laterality Date  . Laparoscopy      age 75 to r/o ovarian cyst  . Labial revision  12/2002    secondary to hypertrophy  . Hemorrhoid surgery  2005-2006  . Dilation and curettage of uterus      Hx: of at 23 yrs.  . Cholecystectomy N/A 05/04/2013    Procedure: LAPAROSCOPIC CHOLECYSTECTOMY;  Surgeon: Wilmon Arms. Corliss Skains, MD;  Location: [facility] OR;  Service: General;  Laterality: N/A;    Current Outpatient Prescriptions  Medication Sig Dispense Refill  . aspirin EC 81 MG tablet Take 81 mg by mouth every evening.    Marland Kitchen atorvastatin (LIPITOR) 20 MG tablet Take 1 tablet (20 mg  total) by mouth every evening. (Patient taking differently: Take 10 mg by mouth every other day. ) 90 tablet 3  . Cholecalciferol (VITAMIN D3) 3000 UNITS TABS Take by mouth.    . Coenzyme Q10 (CO Q 10 PO) Take by mouth daily.    . cyanocobalamin 1000 MCG tablet Take 100 mcg by mouth daily. VITAMIN B12    . fluticasone (FLONASE) 50 MCG/ACT nasal spray PLACE 2 SPRAYS INTO BOTH NOSTRILS DAILY. 16 g 0  . levocetirizine (XYZAL) 5 MG tablet Take 1 tablet (5 mg total) by mouth every morning. 30 tablet 0  . lisinopril-hydrochlorothiazide (PRINZIDE,ZESTORETIC) 10-12.5 MG per tablet Take 1 tablet by mouth daily. 90 tablet 3  . Magnesium 500 MG TABS Take 1 tablet by mouth daily.    . milk thistle 175 MG tablet Take 175 mg by mouth daily.    . NON FORMULARY Take 4 capsules by mouth daily. OMEGA XL    . NONFORMULARY OR COMPOUNDED ITEM     . progesterone (PROMETRIUM) 200 MG capsule Take 200 mg by mouth daily.    . vitamin E (VITAMIN E) 400 UNIT capsule Take 400 Units by mouth daily.    Marland Kitchen scopolamine (TRANSDERM-SCOP) 1 MG/3DAYS Place 1 patch (1.5 mg total) onto the skin every 3 (three) days. (  Patient not taking: Reported on 02/20/2016) 4 patch 1   No current facility-administered medications for this visit.    Family History  Problem Relation Age of Onset  . Hypertension Father   . Brain cancer Father     brain tumor  . Hypertension Sister   . Melanoma Sister     melanoma  . Hyperlipidemia Mother   . Hypertension Mother   . Breast cancer Mother [id number]  . Melanoma Mother [phone]    now in lungs 2017  . Heart disease Maternal Grandmother   . Heart disease Maternal Grandfather   . Heart disease Paternal Grandfather   . Ovarian cancer Other     ROS:  Pertinent items are noted in HPI.  Otherwise, a comprehensive ROS was negative.  Exam:   BP 102/60 mmHg  Pulse 76  Resp 18  Ht 5' 4.5" (1.638 m)  Wt 178 lb (80.74 kg)  BMI 30.09 kg/m2  LMP 06/29/2015 Height: 5' 4.5" (163.8 cm) Ht Readings from Last 3  Encounters:  02/20/16 5' 4.5" (1.638 m)  02/16/16 5' 4.5" (1.638 m)  02/14/15 5' 4.5" (1.638 m)    General appearance: alert, cooperative and appears stated age Head: Normocephalic, without obvious abnormality, atraumatic Neck: no adenopathy, supple, symmetrical, trachea midline and thyroid normal to inspection and palpation Lungs: clear to auscultation bilaterally Breasts: normal appearance, no masses or tenderness Heart: regular rate and rhythm Abdomen: soft, non-tender; no masses,  no organomegaly Extremities: extremities normal, atraumatic, no cyanosis or edema Skin: Skin color, texture, turgor normal. No rashes or lesions Lymph nodes: Cervical, supraclavicular, and axillary nodes normal. No abnormal inguinal nodes palpated Neurologic: Grossly normal   Pelvic: External genitalia:  no lesions              Urethra:  normal appearing urethra with no masses, tenderness or lesions              Bartholin's and Skene's: normal                 Vagina: normal appearing vagina with normal color and discharge, no lesions              Cervix: anteverted              Pap taken: No. Bimanual Exam:  Uterus:  normal size, contour, position, consistency, mobility, non-tender              Adnexa: no mass, fullness, tenderness               Rectovaginal: Confirms               Anus:  normal sphincter tone, no lesions  Chaperone present: no  A:  Well Woman with normal exam  Postmenopausal with bio identical HRT stopped 4 /2014, then restarted secondary to symptoms and now off since 12/2014.  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

## 2016-02-21 LAB — HEPATITIS C ANTIBODY: HCV AB: NEGATIVE

## 2016-02-21 LAB — HIV ANTIBODY (ROUTINE TESTING W REFLEX): HIV 1&2 Ab, 4th Generation: NONREACTIVE

## 2016-02-23 NOTE — Progress Notes (Signed)
Encounter reviewed by Dr. Brook Amundson C. Silva.  

## 2016-04-15 ENCOUNTER — Ambulatory Visit (INDEPENDENT_AMBULATORY_CARE_PROVIDER_SITE_OTHER): Payer: BLUE CROSS/BLUE SHIELD | Admitting: Nurse Practitioner

## 2016-04-15 ENCOUNTER — Encounter: Payer: Self-pay | Admitting: Nurse Practitioner

## 2016-04-15 VITALS — BP 110/60 | HR 72 | Temp 98.1°F | Resp 14 | Ht 64.5 in | Wt 180.0 lb

## 2016-04-15 DIAGNOSIS — R102 Pelvic and perineal pain: Secondary | ICD-10-CM | POA: Diagnosis not present

## 2016-04-15 LAB — POCT URINALYSIS DIPSTICK
BILIRUBIN UA: NEGATIVE
Glucose, UA: NEGATIVE
KETONES UA: NEGATIVE
LEUKOCYTES UA: NEGATIVE
Nitrite, UA: NEGATIVE
PH UA: 5
PROTEIN UA: NEGATIVE
RBC UA: NEGATIVE
Urobilinogen, UA: NEGATIVE

## 2016-04-15 LAB — CBC
HCT: 44 % (ref 35.0–45.0)
Hemoglobin: 14.9 g/dL (ref 11.7–15.5)
MCH: 29.5 pg (ref 27.0–33.0)
MCHC: 33.9 g/dL (ref 32.0–36.0)
MCV: 87.1 fL (ref 80.0–100.0)
MPV: 9.2 fL (ref 7.5–12.5)
PLATELETS: 386 10*3/uL (ref 140–400)
RBC: 5.05 MIL/uL (ref 3.80–5.10)
RDW: 14.2 % (ref 11.0–15.0)
WBC: 8.7 10*3/uL (ref 3.8–10.8)

## 2016-04-15 MED ORDER — FLUCONAZOLE 150 MG PO TABS: 150.0000 mg | ORAL_TABLET | Freq: Once | ORAL | Status: DC

## 2016-04-15 NOTE — Progress Notes (Signed)
Subjective:     Patient ID: Debra Banks, female   DOB: Mar 22, 1956, 60 y.o.   MRN: [id number]  HPI This 60 yo W significant other female complains of lower pelvic pain and pressure with sudden onset last Friday.  Pressure radiated through to her back.   She had external vulvar burning and took 1 day OTC Monistat on Friday.  Still felt 'uncomfortable' and did saline douche on Saturday.  The lower pelvic pressure did not improve. She denies fever/ chills/N/V/ or changes in bowel habits.  Some flatus and GERD that has not changed.  The only new medication has been the hormone pellets given to her on 04/02/16.  She was late getting a new dose inserted.  recently has used toys and thinks this may have caused the infection.  Denies urinary symptoms of urgency or frequency.  Review of Systems  Constitutional: Negative for fever, chills, diaphoresis, appetite change and fatigue.  HENT: Negative.   Respiratory: Negative.   Cardiovascular: Negative.   Gastrointestinal: Positive for abdominal pain. Negative for nausea, vomiting, diarrhea, constipation, blood in stool, abdominal distention, anal bleeding and rectal pain.  Genitourinary: Positive for vaginal discharge and pelvic pain. Negative for dysuria, urgency, frequency, hematuria, flank pain, decreased urine volume, vaginal bleeding, genital sores, vaginal pain and dyspareunia.  Musculoskeletal: Negative.   Neurological: Negative.   Psychiatric/Behavioral: Negative.        Objective:   Physical Exam  Constitutional: She is oriented to person, place, and time. She appears well-developed and well-nourished. No distress.  Cardiovascular: Normal rate.   Pulmonary/Chest: Effort normal.  Abdominal: Soft. She exhibits no distension and no mass. There is tenderness. There is no rebound and no guarding.  Suprapubic and LLQ.  Negative rebound.  Genitourinary:  External vulvar with signs of yeast with thickening and labial cuts.  White vaginal discharge.  Affirm is taken. On bimanual exam she remains tender both quadrants without mass.  Neurological: She is alert and oriented to person, place, and time.  Psychiatric: She has a normal mood and affect. Her behavior is normal. Thought content normal.       Assessment:     External vaginitis R/O BV Lower pelvic pain ? Etiology R/O UTI    Plan:     Will get CBC and follow Will get Affirm and follow Follow with urine C&S RX Diflucan to treat the external symptoms discussed about follow information from manufacture as how to clean toys. If pelvic pain persist would like to get PUS

## 2016-04-15 NOTE — Patient Instructions (Signed)

## 2016-04-16 LAB — URINE CULTURE: Colony Count: 40000

## 2016-04-16 LAB — WET PREP BY MOLECULAR PROBE
Candida species: NEGATIVE
Gardnerella vaginalis: NEGATIVE
TRICHOMONAS VAG: NEGATIVE

## 2016-04-17 NOTE — Progress Notes (Signed)
Encounter reviewed Jill Jertson, MD   

## 2016-07-23 ENCOUNTER — Encounter: Payer: Self-pay | Admitting: Family Medicine

## 2016-07-23 ENCOUNTER — Ambulatory Visit (INDEPENDENT_AMBULATORY_CARE_PROVIDER_SITE_OTHER): Payer: BLUE CROSS/BLUE SHIELD | Admitting: Family Medicine

## 2016-07-23 VITALS — BP 112/72 | HR 79 | Wt 183.2 lb

## 2016-07-23 DIAGNOSIS — G9001 Carotid sinus syncope: Secondary | ICD-10-CM | POA: Diagnosis not present

## 2016-07-23 NOTE — Patient Instructions (Signed)
Take 2 Aleve twice per day for the next 10 days and see what that'll do

## 2016-07-23 NOTE — Progress Notes (Signed)
   Subjective:    Patient ID: Debra Banks, female    DOB: May 03, 1956, 60 y.o.   MRN: [id number]  HPI She complains of a three-week history of left-sided neck discomfort but no pain, weakness, numbness, tingling, skin or hair changes, difficulty with swallowing.   Review of Systems     Objective:   Physical Exam alert and in no distress. Full motion of the neck. Slight tenderness just distal to the carotid notch but no definite pain over that area. No adenopathy noted. 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 Recommend 2 Aleve twice per day for the next 10 days and if continued difficulty, she will call. She was comfortable with this.

## 2016-08-13 ENCOUNTER — Encounter: Payer: Self-pay | Admitting: Family Medicine

## 2016-09-05 ENCOUNTER — Other Ambulatory Visit: Payer: Self-pay | Admitting: Family Medicine

## 2016-09-05 DIAGNOSIS — Z79899 Other long term (current) drug therapy: Secondary | ICD-10-CM

## 2016-09-05 DIAGNOSIS — I10 Essential (primary) hypertension: Secondary | ICD-10-CM

## 2016-09-26 ENCOUNTER — Other Ambulatory Visit: Payer: Self-pay | Admitting: Family Medicine

## 2016-10-08 ENCOUNTER — Other Ambulatory Visit: Payer: BLUE CROSS/BLUE SHIELD

## 2016-10-23 ENCOUNTER — Encounter: Payer: Self-pay | Admitting: Obstetrics and Gynecology

## 2017-02-25 ENCOUNTER — Ambulatory Visit: Payer: BLUE CROSS/BLUE SHIELD | Admitting: Nurse Practitioner

## 2017-03-11 ENCOUNTER — Ambulatory Visit: Payer: Self-pay | Admitting: Nurse Practitioner

## 2017-04-15 ENCOUNTER — Ambulatory Visit (INDEPENDENT_AMBULATORY_CARE_PROVIDER_SITE_OTHER): Payer: 59 | Admitting: Nurse Practitioner

## 2017-04-15 ENCOUNTER — Encounter: Payer: Self-pay | Admitting: Nurse Practitioner

## 2017-04-15 VITALS — BP 104/66 | HR 68 | Ht 64.25 in | Wt 179.0 lb

## 2017-04-15 DIAGNOSIS — Z79890 Hormone replacement therapy (postmenopausal): Secondary | ICD-10-CM | POA: Diagnosis not present

## 2017-04-15 DIAGNOSIS — Z1211 Encounter for screening for malignant neoplasm of colon: Secondary | ICD-10-CM | POA: Diagnosis not present

## 2017-04-15 DIAGNOSIS — Z0000 Encounter for general adult medical examination without abnormal findings: Secondary | ICD-10-CM | POA: Diagnosis not present

## 2017-04-15 DIAGNOSIS — Z01419 Encounter for gynecological examination (general) (routine) without abnormal findings: Secondary | ICD-10-CM | POA: Diagnosis not present

## 2017-04-15 NOTE — Progress Notes (Signed)
Patient ID: Debra Banks, female   DOB: November 06, 1955, [age] y.o.   MRN: [id number]  61 y.o. G0P0000 Significant Other Caucasian Fe here for annual exam.  Estrogen 0.5 mg, testosterone 100 mcg in a pellet 3 months.  Then Prometrium compounded to 150 mg at hs.  She feels well on H[place] which [place] [facility] [place]  Mother with new radiation treatment for melanoma that has spread to lung.  Patient's last menstrual period was 06/29/2015 (exact date).          Sexually active: Yes.    The current method of family planning is none. Female partner. Exercising: No.  The patient does not participate in regular exercise at present. Smoker:  no  Health Maintenance: Pap: 02/14/15, Negative with neg HR HPV  01/21/12, Negative with neg HR HPV History of Abnormal Pap: no MMG: 10/15/16, 3D-yes, Density Category B, Bi-Rads 2:  Benign Self Breast exams: no Colonoscopy: 10/26/11, Normal, repeat in 10 years BMD: Never  TDaP: UTD Hep C and HIV: 02/20/16 Labs: PCP takes care of screening labs    reports that she has quit smoking. She has never used smokeless tobacco. She reports that she drinks about 2.4 - 3.0 oz of alcohol per week . She reports that she does not use drugs.  Past Medical History:  Diagnosis Date  . Allergy    RHINITIS  . Complication of anesthesia   . GERD (gastroesophageal reflux disease)    Hx: of GERD that went away after GB removed  . Hypertension   . Migraines    with aura  none in 3-4 yrs  . PONV (postoperative nausea and vomiting)     Past Surgical History:  Procedure Laterality Date  . CHOLECYSTECTOMY N/A 05/04/2013   Procedure: LAPAROSCOPIC CHOLECYSTECTOMY;  Surgeon: Wilmon ArmsMatthew K. Corliss Skainssuei, MD;  Location: [facility] OR;  Service: General;  Laterality: N/A;  . DILATION AND CURETTAGE OF UTERUS     Hx: of at 23 yrs.  Marland Kitchen. HEMORRHOID SURGERY  2005-2006  . labial revision  12/2002   secondary to hypertrophy  . LAPAROSCOPY     age 61 to r/o ovarian cyst    Current  Outpatient Prescriptions  Medication Sig Dispense Refill  . aspirin EC 81 MG tablet Take 81 mg by mouth every evening.    Marland Kitchen. atorvastatin (LIPITOR) 20 MG tablet Take 1 tablet (20 mg total) by mouth every evening. (Patient taking differently: Take 10 mg by mouth every other day. ) 90 tablet 3  . Cholecalciferol (VITAMIN D3) 3000 UNITS TABS Take by mouth.    . Coenzyme Q10 (CO Q 10 PO) Take by mouth daily.    . cyanocobalamin 1000 MCG tablet Take 100 mcg by mouth daily. VITAMIN B12    . fluticasone (FLONASE) 50 MCG/ACT nasal spray PLACE 2 SPRAYS INTO BOTH NOSTRILS DAILY. 16 g 0  . levocetirizine (XYZAL) 5 MG tablet Take 1 tablet (5 mg total) by mouth every morning. 30 tablet 0  . levocetirizine (XYZAL) 5 MG tablet TAKE 1 TABLET BY MOUTH EVERY MORNING 90 tablet 3  . lisinopril-hydrochlorothiazide (PRINZIDE,ZESTORETIC) 10-12.5 MG tablet TAKE 1 TABLET BY MOUTH DAILY. 90 tablet 3  . Magnesium 500 MG TABS Take 1 tablet by mouth daily.    . milk thistle 175 MG tablet Take 175 mg by mouth daily.    . NON FORMULARY Take 4 capsules by mouth daily. OMEGA XL    . progesterone (PROMETRIUM) 200 MG capsule Take 200 mg by mouth daily.    .Marland Kitchen  vitamin E (VITAMIN E) 400 UNIT capsule Take 400 Units by mouth daily.     No current facility-administered medications for this visit.     Family History  Problem Relation Age of Onset  . Hypertension Father   . Brain cancer Father        brain tumor  . Hypertension Sister   . Melanoma Sister        melanoma  . Hyperlipidemia Mother   . Hypertension Mother   . Breast cancer Mother 28  . Melanoma Mother 79       now in lungs 2017  . Heart disease Maternal Grandmother   . Heart disease Maternal Grandfather   . Heart disease Paternal Grandfather   . Ovarian cancer Other     ROS:  Pertinent items are noted in HPI.  Otherwise, a comprehensive ROS was negative.  Exam:   BP 104/66 (BP Location: Right Arm, Patient Position: Sitting, Cuff Size: Large)   Pulse 68    Ht 5' 4.25" (1.632 m)   Wt 179 lb (81.2 kg)   LMP 06/29/2015 (Exact Date)   BMI 30.49 kg/m  Height: 5' 4.25" (163.2 cm) Ht Readings from Last 3 Encounters:  04/15/17 5' 4.25" (1.632 m)  04/15/16 5' 4.5" (1.638 m)  02/20/16 5' 4.5" (1.638 m)    General appearance: alert, cooperative and appears stated age Head: Normocephalic, without obvious abnormality, atraumatic Neck: no adenopathy, supple, symmetrical, trachea midline and thyroid normal to inspection and palpation Lungs: clear to auscultation bilaterally Breasts: normal appearance, no masses or tenderness Heart: regular rate and rhythm Abdomen: soft, non-tender; no masses,  no organomegaly Extremities: extremities normal, atraumatic, no cyanosis or edema Skin: Skin color, texture, turgor normal. No rashes or lesions Lymph nodes: Cervical, supraclavicular, and axillary nodes normal. No abnormal inguinal nodes palpated Neurologic: Grossly normal   Pelvic: External genitalia:  no lesions              Urethra:  normal appearing urethra with no masses, tenderness or lesions              Bartholin's and Skene's: normal                 Vagina: normal appearing vagina with normal color and discharge, no lesions              Cervix: anteverted              Pap taken: No. Bimanual Exam:  Uterus:  normal size, contour, position, consistency, mobility, non-tender              Adnexa: no mass, fullness, tenderness               Rectovaginal: Confirms               Anus:  normal sphincter tone, no lesions  Chaperone present: no  A:  Well Woman with normal exam  Postmenopausal with Bio Identical HRT - went off and now back on X 1 yr.  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

## 2017-04-15 NOTE — Patient Instructions (Signed)

## 2017-04-17 NOTE — Progress Notes (Signed)
Reviewed personally.  M. Suzanne Hepworth, MD.  

## 2017-05-30 ENCOUNTER — Telehealth: Payer: Self-pay | Admitting: Obstetrics and Gynecology

## 2017-05-30 NOTE — Telephone Encounter (Signed)
Left message for patient to [facility].

## 2017-06-12 ENCOUNTER — Other Ambulatory Visit: Payer: Self-pay | Admitting: Family Medicine

## 2017-08-05 ENCOUNTER — Ambulatory Visit (INDEPENDENT_AMBULATORY_CARE_PROVIDER_SITE_OTHER): Payer: 59 | Admitting: Family Medicine

## 2017-08-05 VITALS — BP 110/68 | HR 69 | Temp 97.7°F | Wt 185.6 lb

## 2017-08-05 DIAGNOSIS — R198 Other specified symptoms and signs involving the digestive system and abdomen: Secondary | ICD-10-CM

## 2017-08-05 DIAGNOSIS — Z23 Encounter for immunization: Secondary | ICD-10-CM

## 2017-08-05 NOTE — Progress Notes (Signed)
   Subjective:    Patient ID: Debra Banks, female    DOB: 1956/05/08, 61 y.o.   MRN: [id number]  HPI She complains of a 6 week history of gagging after she swallows. She states that this occurs once or twice per week. Last less than a minute. She cannot associate this with liquids, solids, medications. She does not complain of indigestion, eructation. She has had gallbladder surgery. No nausea or vomiting. No relation to body position.   Review of Systems     Objective:   Physical Exam Alert and in no distress. Tympanic membranes and canals are normal. Pharyngeal area is normal. Neck is supple without adenopathy or thyromegaly. Cardiac exam shows a regular sinus rhythm without murmurs or gallops. Lungs are clear to auscultation.        Assessment & Plan:  Gagging episode  Need for influenza vaccination - Plan: Flu Vaccine QUAD 6+ mos PF IM (Fluarix Quad PF) I explained it is difficult to say exactly what's going on but this could be indigestion. Recommend that she take her H2 blocker regularly for the next month and let me know. If continued difficulty we'll probably refer to GI for further evaluation.

## 2017-10-01 ENCOUNTER — Other Ambulatory Visit: Payer: Self-pay | Admitting: Family Medicine

## 2017-10-01 DIAGNOSIS — Z79899 Other long term (current) drug therapy: Secondary | ICD-10-CM

## 2017-10-01 DIAGNOSIS — I10 Essential (primary) hypertension: Secondary | ICD-10-CM

## 2018-04-13 ENCOUNTER — Encounter: Payer: Self-pay | Admitting: Obstetrics and Gynecology

## 2018-04-21 ENCOUNTER — Ambulatory Visit: Payer: 59 | Admitting: Nurse Practitioner

## 2018-05-06 ENCOUNTER — Other Ambulatory Visit: Payer: Self-pay | Admitting: Medical

## 2018-06-03 ENCOUNTER — Other Ambulatory Visit: Payer: Self-pay | Admitting: Medical

## 2018-06-03 NOTE — Telephone Encounter (Signed)
Lipids last checked in 2016. Left VM that pt needs an appt for a fasting appt to make sure she is on the right dose since last checked in 2016

## 2018-08-18 ENCOUNTER — Ambulatory Visit (INDEPENDENT_AMBULATORY_CARE_PROVIDER_SITE_OTHER): Payer: 59 | Admitting: Obstetrics and Gynecology

## 2018-08-18 ENCOUNTER — Encounter: Payer: Self-pay | Admitting: Obstetrics and Gynecology

## 2018-08-18 ENCOUNTER — Other Ambulatory Visit: Payer: Self-pay

## 2018-08-18 ENCOUNTER — Other Ambulatory Visit (HOSPITAL_COMMUNITY)
Admission: RE | Admit: 2018-08-18 | Discharge: 2018-08-18 | Disposition: A | Discharge status: Home / Self Care | Payer: BLUE CROSS/BLUE SHIELD | Source: Ambulatory Visit | Attending: Obstetrics and Gynecology | Admitting: Obstetrics and Gynecology

## 2018-08-18 VITALS — BP 104/64 | HR 70 | Resp 12 | Ht 64.5 in | Wt 181.4 lb

## 2018-08-18 DIAGNOSIS — Z01419 Encounter for gynecological examination (general) (routine) without abnormal findings: Secondary | ICD-10-CM

## 2018-08-18 NOTE — Progress Notes (Signed)
62 y.o. G0P0000 Significant Other Caucasian female here for annual exam.    On HRT through [facility].  On Prometrium 200 mg daily and does estrogen and testosterone pellets.   She had vaginal bleeding 8 - 10 months ago.  Lasted for 3 weeks.  She states she was taking too much estrogen.  She had a new dosage of HRT.  She did not have any evaluation.   Has urinary incontinence only if she is gagging.   PCP:   Sharlot Gowda, MD  Patient's last menstrual period was 06/29/2015 (exact date).           Sexually active: No.  The current method of family planning is post menopausal status.    Exercising: No.  The patient does not participate in regular exercise at present. Smoker:  Former smoker   Health Maintenance: Pap:  02-14-15 negative, HR HPV negative           01-21-12 negative, HR HPV negative  History of abnormal Pap:  no MMG:  02-03-18 density B/BIRADS 2 benign  Colonoscopy:  10-26-11 normal, repeat 10 years  BMD:   Did lifeline screen this week.   TDaP:  UTD with PCP Gardasil:   no HIV: 02-20-16 negative  Hep C: 02-20-16 negative  Screening Labs:  Hb today: PCP, Urine today: not collected. Will do with PCP.   reports that she has quit smoking. She has never used smokeless tobacco. She reports that she drinks about 4.0 - 5.0 standard drinks of alcohol per week. She reports that she does not use drugs.  Past Medical History:  Diagnosis Date  . Allergy    RHINITIS  . Complication of anesthesia   . GERD (gastroesophageal reflux disease)    Hx: of GERD that went away after GB removed  . Hypertension   . Migraines    with aura  none in 3-4 yrs  . PONV (postoperative nausea and vomiting)     Past Surgical History:  Procedure Laterality Date  . CHOLECYSTECTOMY N/A 05/04/2013   Procedure: LAPAROSCOPIC CHOLECYSTECTOMY;  Surgeon: Wilmon Arms. Corliss Skains, MD;  Location: [facility] OR;  Service: General;  Laterality: N/A;  . DILATION AND CURETTAGE OF UTERUS     Hx: of at 62 yrs.  Marland Kitchen  HEMORRHOID SURGERY  2005-2006  . labial revision  12/2002   secondary to hypertrophy  . LAPAROSCOPY     age 62 to r/o ovarian cyst    Current Outpatient Medications  Medication Sig Dispense Refill  . aspirin EC 81 MG tablet Take 81 mg by mouth every evening.    Marland Kitchen atorvastatin (LIPITOR) 20 MG tablet TAKE 1 TABLET BY MOUTH EVERY DAY IN THE EVENING 30 tablet 0  . Cholecalciferol (VITAMIN D3) 1000 units CAPS Take by mouth.    . Coenzyme Q10 (CO Q 10 PO) Take by mouth daily.    . cyanocobalamin 1000 MCG tablet Take 100 mcg by mouth daily. VITAMIN B12    . fluticasone (FLONASE) 50 MCG/ACT nasal spray PLACE 2 SPRAYS INTO BOTH NOSTRILS DAILY. 16 g 0  . levocetirizine (XYZAL) 5 MG tablet Take 1 tablet (5 mg total) by mouth every morning. 30 tablet 0  . lisinopril-hydrochlorothiazide (PRINZIDE,ZESTORETIC) 10-12.5 MG tablet TAKE 1 TABLET BY MOUTH DAILY. 90 tablet 3  . Magnesium 500 MG TABS Take 1 tablet by mouth daily.    . milk thistle 175 MG tablet Take 175 mg by mouth daily.    . NON FORMULARY Take 4 capsules by mouth daily.  OMEGA XL    . progesterone (PROMETRIUM) 200 MG capsule Take 200 mg by mouth daily.    . Selenium (SELENIMIN-200 PO) Take 1 capsule by mouth daily.    . vitamin E (VITAMIN E) 400 UNIT capsule Take 400 Units by mouth daily.     No current facility-administered medications for this visit.     Family History  Problem Relation Age of Onset  . Hypertension Father   . Brain cancer Father        brain tumor  . Hypertension Sister   . Melanoma Sister        melanoma  . Hyperlipidemia Mother   . Hypertension Mother   . Breast cancer Mother 59  . Melanoma Mother 36       now in lungs 2017  . Heart disease Maternal Grandmother   . Heart disease Maternal Grandfather   . Heart disease Paternal Grandfather   . Ovarian cancer Other     Review of Systems  Constitutional: Negative.   HENT: Negative.   Eyes: Negative.   Respiratory: Negative.   Cardiovascular: Negative.    Gastrointestinal: Negative.   Endocrine: Negative.   Genitourinary:       Loss of urine with cough or sneeze  Musculoskeletal: Negative.   Skin: Negative.   Allergic/Immunologic: Negative.   Neurological: Negative.   Hematological: Negative.   Psychiatric/Behavioral: Negative.     Exam:   BP 104/64 (BP Location: Right Arm, Patient Position: Sitting, Cuff Size: Normal)   Pulse 70   Resp 12   Ht 5' 4.5" (1.638 m)   Wt 181 lb 6.4 oz (82.3 kg)   LMP 06/29/2015 (Exact Date)   BMI 30.66 kg/m     General appearance: alert, cooperative and appears stated age Head: Normocephalic, without obvious abnormality, atraumatic Neck: no adenopathy, supple, symmetrical, trachea midline and thyroid normal to inspection and palpation Lungs: clear to auscultation bilaterally Breasts: normal appearance, no masses or tenderness, No nipple retraction or dimpling, No nipple discharge or bleeding, No axillary or supraclavicular adenopathy Heart: regular rate and rhythm Abdomen: soft, non-tender; no masses, no organomegaly Extremities: extremities normal, atraumatic, no cyanosis or edema Skin: Skin color, texture, turgor normal. No rashes or lesions Lymph nodes: Cervical, supraclavicular, and axillary nodes normal. No abnormal inguinal nodes palpated Neurologic: Grossly normal  Pelvic: External genitalia:  no lesions              Urethra:  normal appearing urethra with no masses, tenderness or lesions              Bartholins and Skenes: normal                 Vagina: normal appearing vagina with normal color and discharge, no lesions.  Good Kegel.              Cervix: no lesions              Pap taken: Yes.   Bimanual Exam:  Uterus:  normal size, contour, position, consistency, mobility, non-tender              Adnexa: no mass, fullness, tenderness              Rectal exam: Yes.  .  Confirms.              Anus:  normal sphincter tone, no lesions  Chaperone was present for exam.  Assessment:    Well woman visit with normal exam. On HRT.  Postmenopausal bleeding  resolved with dosage change through her prescriber. FH of breast cancer. FH of ovarian cancer.  Mild GSI.   Plan: Mammogram screening. Recommended self breast awareness. Pap and HR HPV as above. Guidelines for Calcium, Vitamin D, regular exercise program including cardiovascular and weight bearing exercise. We reviewed the WHI and increased risks of stroke, DVT, PE, MI, and breast cancer.  She will contact her prescriber to start weaning off.  She will call for any future vaginal bleeding and will need evaluation.   We discussed the proper evaluation for postmenopausal bleeding to rule out malignancy.  Kegel's reviewed. Follow up annually and prn.   After visit summary provided.

## 2018-08-20 LAB — CYTOLOGY - PAP
ADEQUACY: ABSENT
Diagnosis: NEGATIVE
HPV: NOT DETECTED

## 2018-08-20 LAB — RESULTS CONSOLE HPV: CHL HPV: NEGATIVE

## 2018-09-03 ENCOUNTER — Telehealth: Payer: Self-pay

## 2018-09-03 MED ORDER — ATORVASTATIN CALCIUM 20 MG PO TABS: ORAL_TABLET | ORAL | 0 refills | Status: DC

## 2018-09-03 NOTE — Telephone Encounter (Signed)
Patient wants to know if an order can be placed for lipid panel so she can get continued refills on her Atorvastatin. Or if she can get a 30 day supply to last her until she can get in for an appointment.  Please advise. Patient would like a call back

## 2018-09-03 NOTE — Telephone Encounter (Signed)
I called her medication into [facility] pharmacy

## 2018-09-03 NOTE — Telephone Encounter (Signed)
Pt advised and appt made. KH

## 2018-09-15 ENCOUNTER — Encounter: Payer: Self-pay | Admitting: Family Medicine

## 2018-10-15 ENCOUNTER — Encounter: Payer: Self-pay | Admitting: Family Medicine

## 2018-10-15 ENCOUNTER — Ambulatory Visit (INDEPENDENT_AMBULATORY_CARE_PROVIDER_SITE_OTHER): Payer: 59 | Admitting: Family Medicine

## 2018-10-15 VITALS — BP 122/78 | HR 68 | Temp 97.8°F | Wt 183.0 lb

## 2018-10-15 DIAGNOSIS — E785 Hyperlipidemia, unspecified: Secondary | ICD-10-CM | POA: Diagnosis not present

## 2018-10-15 DIAGNOSIS — Z79899 Other long term (current) drug therapy: Secondary | ICD-10-CM | POA: Diagnosis not present

## 2018-10-15 DIAGNOSIS — I10 Essential (primary) hypertension: Secondary | ICD-10-CM

## 2018-10-15 DIAGNOSIS — K219 Gastro-esophageal reflux disease without esophagitis: Secondary | ICD-10-CM

## 2018-10-15 DIAGNOSIS — J309 Allergic rhinitis, unspecified: Secondary | ICD-10-CM | POA: Diagnosis not present

## 2018-10-15 LAB — CBC WITH DIFFERENTIAL/PLATELET
Basophils Absolute: 0 10*3/uL (ref 0.0–0.2)
Basos: 1 %
EOS (ABSOLUTE): 0.2 10*3/uL (ref 0.0–0.4)
EOS: 3 %
HEMATOCRIT: 43.4 % (ref 34.0–46.6)
HEMOGLOBIN: 15.2 g/dL (ref 11.1–15.9)
IMMATURE GRANS (ABS): 0 10*3/uL (ref 0.0–0.1)
Immature Granulocytes: 0 %
LYMPHS ABS: 1.9 10*3/uL (ref 0.7–3.1)
LYMPHS: 30 %
MCH: 30.2 pg (ref 26.6–33.0)
MCHC: 35 g/dL (ref 31.5–35.7)
MCV: 86 fL (ref 79–97)
Monocytes Absolute: 0.6 10*3/uL (ref 0.1–0.9)
Monocytes: 9 %
NEUTROS ABS: 3.6 10*3/uL (ref 1.4–7.0)
Neutrophils: 57 %
Platelets: 419 10*3/uL (ref 150–450)
RBC: 5.04 x10E6/uL (ref 3.77–5.28)
RDW: 12.9 % (ref 12.3–15.4)
WBC: 6.4 10*3/uL (ref 3.4–10.8)

## 2018-10-15 LAB — LIPID PANEL
Chol/HDL Ratio: 3.7 ratio (ref 0.0–4.4)
Cholesterol, Total: 168 mg/dL (ref 100–199)
HDL: 46 mg/dL (ref >39)
LDL CALC: 97 mg/dL (ref 0–99)
Triglycerides: 127 mg/dL (ref 0–149)
VLDL CHOLESTEROL CAL: 25 mg/dL (ref 5–40)

## 2018-10-15 LAB — COMPREHENSIVE METABOLIC PANEL
ALBUMIN: 4.5 g/dL (ref 3.6–4.8)
ALT: 75 IU/L — ABNORMAL HIGH (ref 0–32)
AST: 36 IU/L (ref 0–40)
Albumin/Globulin Ratio: 1.7 (ref 1.2–2.2)
Alkaline Phosphatase: 57 IU/L (ref 39–117)
BILIRUBIN TOTAL: 0.4 mg/dL (ref 0.0–1.2)
BUN / CREAT RATIO: 12 (ref 12–28)
BUN: 12 mg/dL (ref 8–27)
CO2: 24 mmol/L (ref 20–29)
CREATININE: 1.01 mg/dL — AB (ref 0.57–1.00)
Calcium: 9.9 mg/dL (ref 8.7–10.3)
Chloride: 102 mmol/L (ref 96–106)
GFR calc non Af Amer: 61 mL/min/{1.73_m2} (ref >59)
GFR, EST AFRICAN AMERICAN: 70 mL/min/{1.73_m2} (ref >59)
GLOBULIN, TOTAL: 2.7 g/dL (ref 1.5–4.5)
Glucose: 92 mg/dL (ref 65–99)
Potassium: 4.6 mmol/L (ref 3.5–5.2)
SODIUM: 139 mmol/L (ref 134–144)
TOTAL PROTEIN: 7.2 g/dL (ref 6.0–8.5)

## 2018-10-15 MED ORDER — LISINOPRIL-HYDROCHLOROTHIAZIDE 10-12.5 MG PO TABS: 1.0000 | ORAL_TABLET | Freq: Every day | ORAL | 3 refills | Status: DC

## 2018-10-15 MED ORDER — ATORVASTATIN CALCIUM 20 MG PO TABS: ORAL_TABLET | ORAL | 0 refills | Status: DC

## 2018-10-15 NOTE — Progress Notes (Signed)
   Subjective:    Patient ID: Debra Banks, female    DOB: [birth year], 62 y.o.   MRN: [id number]  HPI She is here for an interval evaluation.  She does have reflux disease but is having very little difficulty with that.  She continues on her blood pressure medications without problem.  Her allergies are causing her very little difficulty.  She continues on her statin and is taking this 1/2 pill every other day.  Review of the record indicates she does need her immunizations updated however she has a high deductible and would like to get this through the pharmacy.   Review of Systems     Objective:   Physical Exam Alert and in no distress. Tympanic membranes and canals are normal. Pharyngeal area is normal. Neck is supple without adenopathy or thyromegaly. Cardiac exam shows a regular sinus rhythm without murmurs or gallops. Lungs are clear to auscultation.        Assessment & Plan:  Essential hypertension, benign - Plan: CBC with Differential/Platelet, Comprehensive metabolic panel, lisinopril-hydrochlorothiazide (PRINZIDE,ZESTORETIC) 10-12.5 MG tablet  Encounter for long-term (current) use of medications - Plan: lisinopril-hydrochlorothiazide (PRINZIDE,ZESTORETIC) 10-12.5 MG tablet  Allergic rhinitis, unspecified seasonality, unspecified trigger  Hyperlipidemia LDL goal <130 - Plan: Lipid panel, atorvastatin (LIPITOR) 20 MG tablet  Gastroesophageal reflux disease without esophagitis Encouraged her to continue to take good care of herself.  She will get her immunizations including Shingrix, Tdap and flu at the pharmacy.  She is to make sure that they send us the reports.

## 2018-10-30 ENCOUNTER — Telehealth: Payer: Self-pay

## 2018-10-30 ENCOUNTER — Other Ambulatory Visit: Payer: Self-pay | Admitting: Family Medicine

## 2018-10-30 DIAGNOSIS — E785 Hyperlipidemia, unspecified: Secondary | ICD-10-CM

## 2018-10-30 NOTE — Telephone Encounter (Signed)
Called pt to ask about her lipitor and was asked about her ALT level. pt says she had to take a lot of ibuprofen due to back. Please advise . KH

## 2018-10-30 NOTE — Telephone Encounter (Signed)
We need to continue to monitor it but I am not overly worried about that level.

## 2018-11-02 NOTE — Telephone Encounter (Signed)
Pt was advised KH 

## 2019-08-25 ENCOUNTER — Ambulatory Visit: Payer: 59 | Admitting: Obstetrics and Gynecology

## 2019-11-06 ENCOUNTER — Other Ambulatory Visit: Payer: Self-pay | Admitting: Family Medicine

## 2019-11-06 DIAGNOSIS — Z79899 Other long term (current) drug therapy: Secondary | ICD-10-CM

## 2019-11-06 DIAGNOSIS — I10 Essential (primary) hypertension: Secondary | ICD-10-CM

## 2019-11-22 ENCOUNTER — Encounter: Payer: Self-pay | Admitting: Family Medicine

## 2019-11-22 ENCOUNTER — Ambulatory Visit (INDEPENDENT_AMBULATORY_CARE_PROVIDER_SITE_OTHER): Payer: 59 | Admitting: Family Medicine

## 2019-11-22 ENCOUNTER — Other Ambulatory Visit: Payer: Self-pay

## 2019-11-22 VITALS — BP 120/82 | HR 68 | Temp 97.7°F | Wt 173.2 lb

## 2019-11-22 DIAGNOSIS — I10 Essential (primary) hypertension: Secondary | ICD-10-CM | POA: Diagnosis not present

## 2019-11-22 DIAGNOSIS — Z23 Encounter for immunization: Secondary | ICD-10-CM | POA: Diagnosis not present

## 2019-11-22 DIAGNOSIS — Z1211 Encounter for screening for malignant neoplasm of colon: Secondary | ICD-10-CM

## 2019-11-22 DIAGNOSIS — K219 Gastro-esophageal reflux disease without esophagitis: Secondary | ICD-10-CM

## 2019-11-22 DIAGNOSIS — E785 Hyperlipidemia, unspecified: Secondary | ICD-10-CM | POA: Diagnosis not present

## 2019-11-22 DIAGNOSIS — J309 Allergic rhinitis, unspecified: Secondary | ICD-10-CM | POA: Diagnosis not present

## 2019-11-22 DIAGNOSIS — Z79899 Other long term (current) drug therapy: Secondary | ICD-10-CM | POA: Diagnosis not present

## 2019-11-22 NOTE — Progress Notes (Signed)
   Subjective:    Patient ID: Debra Banks, female    DOB: [birth year], 64 y.o.   MRN: [id number]  HPI She is here for an interval evaluation.  She does go to [facility] in [place] and apparently is on HRT from them including use of progesterone, estrogen and testosterone.  She does have hypertension and continues on lisinopril/HCTZ.  She has had no cough or edema.  She is also taking Lipitor without aches or pains.  Her allergies seem to be under good control.  Family and social history as well as health maintenance and immunizations was reviewed   Review of Systems     Objective:   Physical Exam Alert and in no distress. Tympanic membranes and canals are normal. Pharyngeal area is normal. Neck is supple without adenopathy or thyromegaly. Cardiac exam shows a regular sinus rhythm without murmurs or gallops. Lungs are clear to auscultation.        Assessment & Plan:  Essential hypertension, benign - Plan: CBC with Differential/Platelet, Comprehensive metabolic panel  Encounter for long-term (current) use of medications  Allergic rhinitis, unspecified seasonality, unspecified trigger  Hyperlipidemia LDL goal <130 - Plan: Lipid panel  Gastroesophageal reflux disease without esophagitis  Need for shingles vaccine - Plan: Varicella-zoster vaccine IM, CANCELED: Varicella-zoster vaccine subcutaneous  Need for Tdap vaccination - Plan: Tdap vaccine greater than or equal to 7yo IM  Screening for colon cancer - Plan: Cologuard I encouraged her to continue on her present medication regimen.  Immunizations were updated.  She will also be set up to do Cologuard.  Recheck here in 1 year or sooner if problems.

## 2019-11-23 LAB — COMPREHENSIVE METABOLIC PANEL
ALT: 34 IU/L — ABNORMAL HIGH (ref 0–32)
AST: 20 IU/L (ref 0–40)
Albumin/Globulin Ratio: 1.8 (ref 1.2–2.2)
Albumin: 4.7 g/dL (ref 3.8–4.8)
Alkaline Phosphatase: 53 IU/L (ref 39–117)
BUN/Creatinine Ratio: 15 (ref 12–28)
BUN: 12 mg/dL (ref 8–27)
Bilirubin Total: 0.4 mg/dL (ref 0.0–1.2)
CO2: 26 mmol/L (ref 20–29)
Calcium: 9.7 mg/dL (ref 8.7–10.3)
Chloride: 102 mmol/L (ref 96–106)
Creatinine, Ser: 0.79 mg/dL (ref 0.57–1.00)
GFR calc Af Amer: 93 mL/min/{1.73_m2} (ref >59)
GFR calc non Af Amer: 81 mL/min/{1.73_m2} (ref >59)
Globulin, Total: 2.6 g/dL (ref 1.5–4.5)
Glucose: 90 mg/dL (ref 65–99)
Potassium: 4.6 mmol/L (ref 3.5–5.2)
Sodium: 139 mmol/L (ref 134–144)
Total Protein: 7.3 g/dL (ref 6.0–8.5)

## 2019-11-23 LAB — CBC WITH DIFFERENTIAL/PLATELET
Basophils Absolute: 0 10*3/uL (ref 0.0–0.2)
Basos: 0 %
EOS (ABSOLUTE): 0.2 10*3/uL (ref 0.0–0.4)
Eos: 2 %
Hematocrit: 42.8 % (ref 34.0–46.6)
Hemoglobin: 14.6 g/dL (ref 11.1–15.9)
Immature Grans (Abs): 0 10*3/uL (ref 0.0–0.1)
Immature Granulocytes: 0 %
Lymphocytes Absolute: 1.9 10*3/uL (ref 0.7–3.1)
Lymphs: 31 %
MCH: 29.8 pg (ref 26.6–33.0)
MCHC: 34.1 g/dL (ref 31.5–35.7)
MCV: 87 fL (ref 79–97)
Monocytes Absolute: 0.4 10*3/uL (ref 0.1–0.9)
Monocytes: 7 %
Neutrophils Absolute: 3.6 10*3/uL (ref 1.4–7.0)
Neutrophils: 60 %
Platelets: 366 10*3/uL (ref 150–450)
RBC: 4.9 x10E6/uL (ref 3.77–5.28)
RDW: 12.4 % (ref 11.7–15.4)
WBC: 6.2 10*3/uL (ref 3.4–10.8)

## 2019-11-23 LAB — LIPID PANEL
Chol/HDL Ratio: 2.8 ratio (ref 0.0–4.4)
Cholesterol, Total: 150 mg/dL (ref 100–199)
HDL: 54 mg/dL (ref >39)
LDL Chol Calc (NIH): 80 mg/dL (ref 0–99)
Triglycerides: 86 mg/dL (ref 0–149)
VLDL Cholesterol Cal: 16 mg/dL (ref 5–40)

## 2019-11-23 MED ORDER — ATORVASTATIN CALCIUM 20 MG PO TABS: ORAL_TABLET | ORAL | 3 refills | Status: DC

## 2019-11-23 MED ORDER — LISINOPRIL-HYDROCHLOROTHIAZIDE 10-12.5 MG PO TABS: 1.0000 | ORAL_TABLET | Freq: Every day | ORAL | 3 refills | Status: DC

## 2019-11-23 NOTE — Addendum Note (Signed)
Addended by: Ronnald Nian on: 11/23/2019 08:25 AM   Modules accepted: Orders

## 2019-12-22 LAB — COLOGUARD: Cologuard: NEGATIVE

## 2019-12-28 NOTE — Progress Notes (Signed)
Called pt to advise of negative cologuard results . No answer and lvm and will send my chart message. KH

## 2020-03-28 ENCOUNTER — Other Ambulatory Visit: Payer: 59

## 2020-03-29 ENCOUNTER — Other Ambulatory Visit: Payer: Self-pay

## 2020-03-29 ENCOUNTER — Other Ambulatory Visit (INDEPENDENT_AMBULATORY_CARE_PROVIDER_SITE_OTHER): Payer: 59

## 2020-03-29 DIAGNOSIS — Z23 Encounter for immunization: Secondary | ICD-10-CM | POA: Diagnosis not present

## 2020-04-04 LAB — HM MAMMOGRAPHY

## 2020-06-04 LAB — HM MAMMOGRAPHY

## 2020-12-01 ENCOUNTER — Other Ambulatory Visit: Payer: Self-pay | Admitting: Family Medicine

## 2020-12-01 DIAGNOSIS — E785 Hyperlipidemia, unspecified: Secondary | ICD-10-CM

## 2020-12-05 ENCOUNTER — Other Ambulatory Visit: Payer: Self-pay | Admitting: Family Medicine

## 2020-12-05 DIAGNOSIS — I10 Essential (primary) hypertension: Secondary | ICD-10-CM

## 2020-12-05 DIAGNOSIS — Z79899 Other long term (current) drug therapy: Secondary | ICD-10-CM

## 2020-12-27 ENCOUNTER — Other Ambulatory Visit: Payer: Self-pay | Admitting: Family Medicine

## 2020-12-27 DIAGNOSIS — E785 Hyperlipidemia, unspecified: Secondary | ICD-10-CM

## 2021-01-27 ENCOUNTER — Other Ambulatory Visit: Payer: Self-pay | Admitting: Family Medicine

## 2021-01-27 DIAGNOSIS — E785 Hyperlipidemia, unspecified: Secondary | ICD-10-CM

## 2021-02-08 ENCOUNTER — Other Ambulatory Visit: Payer: Self-pay | Admitting: Family Medicine

## 2021-02-08 DIAGNOSIS — E785 Hyperlipidemia, unspecified: Secondary | ICD-10-CM

## 2021-02-13 ENCOUNTER — Encounter: Payer: Self-pay | Admitting: Family Medicine

## 2021-03-04 ENCOUNTER — Other Ambulatory Visit: Payer: Self-pay | Admitting: Family Medicine

## 2021-03-04 DIAGNOSIS — I10 Essential (primary) hypertension: Secondary | ICD-10-CM

## 2021-03-04 DIAGNOSIS — Z79899 Other long term (current) drug therapy: Secondary | ICD-10-CM

## 2021-03-06 ENCOUNTER — Encounter: Payer: Self-pay | Admitting: Family Medicine

## 2021-03-20 ENCOUNTER — Encounter: Payer: Self-pay | Admitting: Family Medicine

## 2021-03-20 ENCOUNTER — Ambulatory Visit (INDEPENDENT_AMBULATORY_CARE_PROVIDER_SITE_OTHER): Payer: 59 | Admitting: Family Medicine

## 2021-03-20 ENCOUNTER — Other Ambulatory Visit: Payer: Self-pay

## 2021-03-20 VITALS — BP 124/82 | HR 70 | Temp 98.6°F | Ht 64.0 in | Wt 163.8 lb

## 2021-03-20 DIAGNOSIS — E785 Hyperlipidemia, unspecified: Secondary | ICD-10-CM | POA: Diagnosis not present

## 2021-03-20 DIAGNOSIS — Z0000 Encounter for general adult medical examination without abnormal findings: Secondary | ICD-10-CM | POA: Diagnosis not present

## 2021-03-20 DIAGNOSIS — J309 Allergic rhinitis, unspecified: Secondary | ICD-10-CM | POA: Diagnosis not present

## 2021-03-20 DIAGNOSIS — Z79899 Other long term (current) drug therapy: Secondary | ICD-10-CM | POA: Diagnosis not present

## 2021-03-20 DIAGNOSIS — I10 Essential (primary) hypertension: Secondary | ICD-10-CM | POA: Diagnosis not present

## 2021-03-20 DIAGNOSIS — Z803 Family history of malignant neoplasm of breast: Secondary | ICD-10-CM

## 2021-03-20 MED ORDER — LISINOPRIL-HYDROCHLOROTHIAZIDE 10-12.5 MG PO TABS: 1.0000 | ORAL_TABLET | Freq: Every day | ORAL | 3 refills | Status: DC

## 2021-03-20 MED ORDER — ATORVASTATIN CALCIUM 20 MG PO TABS: 20.0000 mg | ORAL_TABLET | Freq: Every evening | ORAL | 3 refills | Status: DC

## 2021-03-20 NOTE — Progress Notes (Signed)
   Subjective:    Patient ID: Debra Banks, female    DOB: 1956-01-29, 65 y.o.   MRN: [id number]  HPI She is here for complete examination.  She continues on her statin drug as well as lisinopril/HCTZ without trouble.  She is taking Xyzal and Flonase for her allergies and doing fine on that.  She is also on several OTC medications and having no difficulty with them.  Her work is going well.  She is single and not dating anyone at the present time.  Her mother did have breast cancer.  She does get yearly mammograms.  She plans to follow-up with her gynecologist concerning Pap and pelvic.  She has had a recent Cologuard.  Otherwise family and social history as well as health maintenance and immunizations was reviewed.   Review of Systems  All other systems reviewed and are negative.      Objective:   Physical Exam Alert and in no distress. Tympanic membranes and canals are normal. Pharyngeal area is normal. Neck is supple without adenopathy or thyromegaly. Cardiac exam shows a regular sinus rhythm without murmurs or gallops. Lungs are clear to auscultation.  Abdominal exam shows normal bowel sounds without masses or tenderness       Assessment & Plan:  Routine general medical examination at a health care facility - Plan: CBC with Differential/Platelet, Comprehensive metabolic panel, Lipid panel  Allergic rhinitis, unspecified seasonality, unspecified trigger  Essential hypertension, benign - Plan: lisinopril-hydrochlorothiazide (ZESTORETIC) 10-12.5 MG tablet, CBC with Differential/Platelet, Comprehensive metabolic panel  Hyperlipidemia LDL goal <130 - Plan: atorvastatin (LIPITOR) 20 MG tablet, Lipid panel  Encounter for long-term (current) use of medications - Plan: lisinopril-hydrochlorothiazide (ZESTORETIC) 10-12.5 MG tablet  Family history of breast cancer in mother She will continue on her present medication regimen.  She will follow-up with gynecology and also get a  mammogram.

## 2021-03-21 LAB — COMPREHENSIVE METABOLIC PANEL
ALT: 35 IU/L — ABNORMAL HIGH (ref 0–32)
AST: 20 IU/L (ref 0–40)
Albumin/Globulin Ratio: 2.1 (ref 1.2–2.2)
Albumin: 4.8 g/dL (ref 3.8–4.8)
Alkaline Phosphatase: 52 IU/L (ref 44–121)
BUN/Creatinine Ratio: 16 (ref 12–28)
BUN: 15 mg/dL (ref 8–27)
Bilirubin Total: 0.5 mg/dL (ref 0.0–1.2)
CO2: 23 mmol/L (ref 20–29)
Calcium: 9.9 mg/dL (ref 8.7–10.3)
Chloride: 101 mmol/L (ref 96–106)
Creatinine, Ser: 0.91 mg/dL (ref 0.57–1.00)
Globulin, Total: 2.3 g/dL (ref 1.5–4.5)
Glucose: 96 mg/dL (ref 65–99)
Potassium: 4.7 mmol/L (ref 3.5–5.2)
Sodium: 139 mmol/L (ref 134–144)
Total Protein: 7.1 g/dL (ref 6.0–8.5)
eGFR: 71 mL/min/{1.73_m2} (ref >59)

## 2021-03-21 LAB — CBC WITH DIFFERENTIAL/PLATELET
Basophils Absolute: 0 10*3/uL (ref 0.0–0.2)
Basos: 1 %
EOS (ABSOLUTE): 0.2 10*3/uL (ref 0.0–0.4)
Eos: 3 %
Hematocrit: 42.4 % (ref 34.0–46.6)
Hemoglobin: 14.5 g/dL (ref 11.1–15.9)
Immature Grans (Abs): 0 10*3/uL (ref 0.0–0.1)
Immature Granulocytes: 0 %
Lymphocytes Absolute: 1.7 10*3/uL (ref 0.7–3.1)
Lymphs: 30 %
MCH: 29.6 pg (ref 26.6–33.0)
MCHC: 34.2 g/dL (ref 31.5–35.7)
MCV: 87 fL (ref 79–97)
Monocytes Absolute: 0.5 10*3/uL (ref 0.1–0.9)
Monocytes: 9 %
Neutrophils Absolute: 3.2 10*3/uL (ref 1.4–7.0)
Neutrophils: 57 %
Platelets: 354 10*3/uL (ref 150–450)
RBC: 4.9 x10E6/uL (ref 3.77–5.28)
RDW: 13.1 % (ref 11.7–15.4)
WBC: 5.6 10*3/uL (ref 3.4–10.8)

## 2021-03-21 LAB — LIPID PANEL
Chol/HDL Ratio: 3.3 ratio (ref 0.0–4.4)
Cholesterol, Total: 153 mg/dL (ref 100–199)
HDL: 46 mg/dL (ref >39)
LDL Chol Calc (NIH): 91 mg/dL (ref 0–99)
Triglycerides: 82 mg/dL (ref 0–149)
VLDL Cholesterol Cal: 16 mg/dL (ref 5–40)

## 2021-04-10 LAB — HM MAMMOGRAPHY

## 2021-04-24 ENCOUNTER — Encounter: Payer: Self-pay | Admitting: Family Medicine

## 2022-04-04 ENCOUNTER — Encounter: Payer: 59 | Admitting: Family Medicine

## 2022-04-16 ENCOUNTER — Other Ambulatory Visit: Payer: Self-pay | Admitting: Family Medicine

## 2022-04-16 DIAGNOSIS — Z79899 Other long term (current) drug therapy: Secondary | ICD-10-CM

## 2022-04-16 DIAGNOSIS — I10 Essential (primary) hypertension: Secondary | ICD-10-CM

## 2022-04-16 LAB — HM MAMMOGRAPHY

## 2022-05-09 ENCOUNTER — Other Ambulatory Visit: Payer: Self-pay | Admitting: Family Medicine

## 2022-05-09 DIAGNOSIS — Z79899 Other long term (current) drug therapy: Secondary | ICD-10-CM

## 2022-05-09 DIAGNOSIS — I10 Essential (primary) hypertension: Secondary | ICD-10-CM

## 2022-05-09 NOTE — Telephone Encounter (Signed)
Pt has an appt on 7/25

## 2022-05-21 ENCOUNTER — Ambulatory Visit (INDEPENDENT_AMBULATORY_CARE_PROVIDER_SITE_OTHER): Payer: Commercial Managed Care - PPO | Admitting: Family Medicine

## 2022-05-21 ENCOUNTER — Encounter: Payer: Self-pay | Admitting: Family Medicine

## 2022-05-21 VITALS — BP 100/66 | HR 72 | Temp 98.4°F | Ht 64.0 in | Wt 168.6 lb

## 2022-05-21 DIAGNOSIS — Z79899 Other long term (current) drug therapy: Secondary | ICD-10-CM | POA: Diagnosis not present

## 2022-05-21 DIAGNOSIS — E785 Hyperlipidemia, unspecified: Secondary | ICD-10-CM | POA: Diagnosis not present

## 2022-05-21 DIAGNOSIS — I10 Essential (primary) hypertension: Secondary | ICD-10-CM | POA: Diagnosis not present

## 2022-05-21 DIAGNOSIS — J309 Allergic rhinitis, unspecified: Secondary | ICD-10-CM | POA: Diagnosis not present

## 2022-05-21 DIAGNOSIS — Z0000 Encounter for general adult medical examination without abnormal findings: Secondary | ICD-10-CM

## 2022-05-21 MED ORDER — ATORVASTATIN CALCIUM 20 MG PO TABS: 20.0000 mg | ORAL_TABLET | Freq: Every evening | ORAL | 3 refills | Status: AC

## 2022-05-21 MED ORDER — LISINOPRIL-HYDROCHLOROTHIAZIDE 10-12.5 MG PO TABS: 1.0000 | ORAL_TABLET | Freq: Every day | ORAL | 3 refills | Status: AC

## 2022-05-21 MED ORDER — LEVOCETIRIZINE DIHYDROCHLORIDE 5 MG PO TABS: 5.0000 mg | ORAL_TABLET | Freq: Every morning | ORAL | 3 refills | Status: AC

## 2022-05-21 NOTE — Patient Instructions (Signed)

## 2022-05-21 NOTE — Progress Notes (Signed)
Complete physical exam  Patient: Debra Banks   DOB: Oct 21, 1956   66 y.o. Female  MRN: [id number]  Subjective:    Chief Complaint  Patient presents with   Annual Exam    Fasting     Debra Banks is a 66 y.o. female who presents today for a complete physical exam. She reports consuming a general diet.  Staying active at least 20 minutes a day.  She generally feels well. She reports sleeping well.  She continues on lisinopril/HCTZ and having no difficulty with that.  Atorvastatin causes no aches or pains.  Her allergies are under good control on Flonase and Xyzal.  She does not have additional problems to discuss today.  She is retired and is now in a 59-monthrelationship which seems to be going quite nicely.  Otherwise her family and social history as well as health maintenance and immunizations was reviewed. She is up-to-date on her immunizations as well as health maintenance.  Most recent fall risk assessment:    03/20/2021    8:46 AM  Fall Risk   Falls in the past year? 0  Number falls in past yr: 0  Injury with Fall? 0  Risk for fall due to : No Fall Risks  Follow up Falls evaluation completed     Most recent depression screenings:    05/21/2022    9:05 AM 03/20/2021    8:46 AM  PHQ 2/9 Scores  PHQ - 2 Score 0 0      Patient Active Problem List   Diagnosis Date Noted   Encounter for long-term (current) use of medications 10/15/2018   Hyperlipidemia LDL goal <130 12/26/2012   Allergic rhinitis 11/05/2011   Essential hypertension, benign 11/05/2011   Past Medical History:  Diagnosis Date   Allergy    RHINITIS   Complication of anesthesia    GERD (gastroesophageal reflux disease)    Hx: of GERD that went away after GB removed   Hypertension    Migraines    with aura  none in 3-4 yrs   PONV (postoperative nausea and vomiting)    Past Surgical History:  Procedure Laterality Date   CHOLECYSTECTOMY N/A 05/04/2013   Procedure: LAPAROSCOPIC CHOLECYSTECTOMY;   Surgeon: MImogene Burn TGeorgette Dover MD;  [facility];  Service: General;  Laterality: N/A;   DILATION AND CURETTAGE OF UTERUS     Hx: of at 23 yrs.   HEMORRHOID SURGERY  2005-2006   labial revision  12/2002   secondary to hypertrophy   LAPAROSCOPY     age [age]to r/o ovarian cyst   Social History   Tobacco Use   Smoking status: Former   Smokeless tobacco: Never  Vaping Use   Vaping Use: Never used  Substance Use Topics   Alcohol use: Yes    Alcohol/week: 4.0 - 5.0 standard drinks of alcohol    Types: 4 - 5 Cans of beer per week    Comment: 12 pack a week 2-3 days a week   Drug use: No   Family History  Problem Relation Age of Onset   Hypertension Father    Brain cancer Father        brain tumor   Hypertension Sister    Melanoma Sister        melanoma   Hyperlipidemia Mother    Hypertension Mother    Breast cancer Mother 775  Melanoma Mother 772      now in lungs 2017   Heart  disease Maternal Grandmother    Heart disease Maternal Grandfather    Heart disease Paternal Grandfather    Ovarian cancer Other    Allergies  Allergen Reactions   Augmentin [Amoxicillin-Pot Clavulanate]     Severe body aches and diarrhea will not try again.    Codeine Nausea And Vomiting      Patient Care Team: Denita Lung, MD as PCP - General (Family Medicine)   Outpatient Medications Prior to Visit  Medication Sig   atorvastatin (LIPITOR) 20 MG tablet Take 1 tablet (20 mg total) by mouth every evening.   Cholecalciferol (VITAMIN D3) 1000 units CAPS Take by mouth.   Coenzyme Q10 (CO Q 10 PO) Take by mouth daily.   cyanocobalamin 1000 MCG tablet Take 100 mcg by mouth daily. VITAMIN B12   fluticasone (FLONASE) 50 MCG/ACT nasal spray PLACE 2 SPRAYS INTO BOTH NOSTRILS DAILY.   levocetirizine (XYZAL) 5 MG tablet Take 1 tablet (5 mg total) by mouth every morning.   lisinopril-hydrochlorothiazide (ZESTORETIC) 10-12.5 MG tablet TAKE 1 TABLET BY MOUTH EVERY DAY   Magnesium 500 MG TABS Take 1  tablet by mouth daily.   milk thistle 175 MG tablet Take 175 mg by mouth daily.   progesterone (PROMETRIUM) 200 MG capsule Take 200 mg by mouth daily.   Selenium (SELENIMIN-200 PO) Take 1 capsule by mouth daily.   vitamin E 180 MG (400 UNITS) capsule Take 400 Units by mouth daily.   [DISCONTINUED] NON FORMULARY Take 4 capsules by mouth daily. OMEGA XL   No facility-administered medications prior to visit.    Review of Systems  All other systems reviewed and are negative.         Objective:     LMP 06/29/2015 (Exact Date)  BP Readings from Last 3 Encounters:  03/20/21 124/82  11/22/19 120/82  10/15/18 122/78   Wt Readings from Last 3 Encounters:  03/20/21 163 lb 12.8 oz (74.3 kg)  11/22/19 173 lb 3.2 oz (78.6 kg)  10/15/18 183 lb (83 kg)      Physical Exam  Alert and in no distress. Tympanic membranes and canals are normal. Pharyngeal area is normal. Neck is supple without adenopathy or thyromegaly. Cardiac exam shows a regular sinus rhythm without murmurs or gallops. Lungs are clear to auscultation.  Last CBC Lab Results  Component Value Date   WBC 5.6 03/20/2021   HGB 14.5 03/20/2021   HCT 42.4 03/20/2021   MCV 87 03/20/2021   MCH 29.6 03/20/2021   RDW 13.1 03/20/2021   PLT 354 38/75/6433   Last metabolic panel Lab Results  Component Value Date   GLUCOSE 96 03/20/2021   NA 139 03/20/2021   K 4.7 03/20/2021   CL 101 03/20/2021   CO2 23 03/20/2021   BUN 15 03/20/2021   CREATININE 0.91 03/20/2021   EGFR 71 03/20/2021   CALCIUM 9.9 03/20/2021   PHOS 3.8 12/26/2012   PROT 7.1 03/20/2021   ALBUMIN 4.8 03/20/2021   LABGLOB 2.3 03/20/2021   AGRATIO 2.1 03/20/2021   BILITOT 0.5 03/20/2021   ALKPHOS 52 03/20/2021   AST 20 03/20/2021   ALT 35 (H) 03/20/2021   Last lipids Lab Results  Component Value Date   CHOL 153 03/20/2021   HDL 46 03/20/2021   LDLCALC 91 03/20/2021   TRIG 82 03/20/2021   CHOLHDL 3.3 03/20/2021   Last vitamin D No results found  for: "25OHVITD2", "25OHVITD3", "VD25OH"      Assessment & Plan:    Routine general medical  examination at a health care facility  Essential hypertension, benign - Plan: CBC with Differential/Platelet, Comprehensive metabolic panel, lisinopril-hydrochlorothiazide (ZESTORETIC) 10-12.5 MG tablet  Allergic rhinitis, unspecified seasonality, unspecified trigger - Plan: levocetirizine (XYZAL) 5 MG tablet  Encounter for long-term (current) use of medications - Plan: CBC with Differential/Platelet, Comprehensive metabolic panel, Lipid panel, lisinopril-hydrochlorothiazide (ZESTORETIC) 10-12.5 MG tablet  Hyperlipidemia LDL goal <130 - Plan: Lipid panel, atorvastatin (LIPITOR) 20 MG tablet  Immunization History  Administered Date(s) Administered   Influenza Split 11/05/2011, 08/03/2013, 10/08/2016   Influenza Whole 09/11/2010   Influenza, Quadrivalent, Recombinant, Inj, Pf 09/28/2019   Influenza,inj,Quad PF,6+ Mos 06/17/2014, 08/05/2017   Influenza-Unspecified 10/16/2018   PFIZER(Purple Top)SARS-COV-2 Vaccination 01/20/2020, 02/14/2020, 09/03/2020   Td 10/29/1999   Tdap 11/22/2019   Zoster Recombinat (Shingrix) 11/22/2019, 03/29/2020    Health Maintenance  Topic Date Due   COVID-19 Vaccine (4 - Booster for Pfizer series) 10/29/2020   INFLUENZA VACCINE  05/28/2022   Fecal DNA (Cologuard)  12/21/2022   PAP SMEAR-Modifier  08/19/2023   MAMMOGRAM  04/16/2024   TETANUS/TDAP  11/21/2029   Hepatitis C Screening  Completed   HIV Screening  Completed   Zoster Vaccines- Shingrix  Completed   HPV VACCINES  Aged Out   COLONOSCOPY (Pts 45-80yr Insurance coverage will need to be confirmed)  Discontinued    Discussed health benefits of physical activity, and encouraged her to engage in regular exercise appropriate for her age and condition.  Problem List Items Addressed This Visit       Cardiovascular and Mediastinum   Essential hypertension, benign - Primary     Respiratory   Allergic  rhinitis     Other   Hyperlipidemia LDL goal <130   Encounter for long-term (current) use of medications   Return in about 1 year (around 05/22/2023) for cpe .     KElyse Jarvis RMA

## 2022-05-22 LAB — COMPREHENSIVE METABOLIC PANEL
ALT: 58 IU/L — ABNORMAL HIGH (ref 0–32)
AST: 32 IU/L (ref 0–40)
Albumin/Globulin Ratio: 1.8 (ref 1.2–2.2)
Albumin: 4.7 g/dL (ref 3.9–4.9)
Alkaline Phosphatase: 56 IU/L (ref 44–121)
BUN/Creatinine Ratio: 16 (ref 12–28)
BUN: 15 mg/dL (ref 8–27)
Bilirubin Total: 0.5 mg/dL (ref 0.0–1.2)
CO2: 25 mmol/L (ref 20–29)
Calcium: 10 mg/dL (ref 8.7–10.3)
Chloride: 100 mmol/L (ref 96–106)
Creatinine, Ser: 0.92 mg/dL (ref 0.57–1.00)
Globulin, Total: 2.6 g/dL (ref 1.5–4.5)
Glucose: 103 mg/dL — ABNORMAL HIGH (ref 70–99)
Potassium: 4.6 mmol/L (ref 3.5–5.2)
Sodium: 139 mmol/L (ref 134–144)
Total Protein: 7.3 g/dL (ref 6.0–8.5)
eGFR: 70 mL/min/{1.73_m2} (ref >59)

## 2022-05-22 LAB — CBC WITH DIFFERENTIAL/PLATELET
Basophils Absolute: 0 10*3/uL (ref 0.0–0.2)
Basos: 0 %
EOS (ABSOLUTE): 0.2 10*3/uL (ref 0.0–0.4)
Eos: 3 %
Hematocrit: 46.1 % (ref 34.0–46.6)
Hemoglobin: 15.2 g/dL (ref 11.1–15.9)
Immature Grans (Abs): 0 10*3/uL (ref 0.0–0.1)
Immature Granulocytes: 0 %
Lymphocytes Absolute: 1.6 10*3/uL (ref 0.7–3.1)
Lymphs: 28 %
MCH: 30.2 pg (ref 26.6–33.0)
MCHC: 33 g/dL (ref 31.5–35.7)
MCV: 92 fL (ref 79–97)
Monocytes Absolute: 0.5 10*3/uL (ref 0.1–0.9)
Monocytes: 10 %
Neutrophils Absolute: 3.3 10*3/uL (ref 1.4–7.0)
Neutrophils: 59 %
Platelets: 336 10*3/uL (ref 150–450)
RBC: 5.04 x10E6/uL (ref 3.77–5.28)
RDW: 12.3 % (ref 11.7–15.4)
WBC: 5.6 10*3/uL (ref 3.4–10.8)

## 2022-05-22 LAB — LIPID PANEL
Chol/HDL Ratio: 3.2 ratio (ref 0.0–4.4)
Cholesterol, Total: 161 mg/dL (ref 100–199)
HDL: 51 mg/dL (ref >39)
LDL Chol Calc (NIH): 92 mg/dL (ref 0–99)
Triglycerides: 96 mg/dL (ref 0–149)
VLDL Cholesterol Cal: 18 mg/dL (ref 5–40)

## 2022-07-03 ENCOUNTER — Encounter: Payer: Self-pay | Admitting: Internal Medicine

## 2022-07-22 ENCOUNTER — Telehealth: Payer: Self-pay | Admitting: Family Medicine

## 2022-07-22 NOTE — Telephone Encounter (Signed)
Pt called and is wanting to have repeat blood work done on October 25 states her ALT was 58 on July 25 and she wants to have that rechecked if possible

## 2022-08-06 ENCOUNTER — Encounter: Payer: Self-pay | Admitting: Internal Medicine

## 2022-08-19 ENCOUNTER — Encounter: Payer: Self-pay | Admitting: Internal Medicine

## 2023-05-29 ENCOUNTER — Encounter: Payer: Commercial Managed Care - PPO | Admitting: Family Medicine

## 2023-12-23 ENCOUNTER — Encounter: Payer: Self-pay | Admitting: Internal Medicine
